# Patient Record
Sex: Male | Born: 1970 | Race: White | Hispanic: No | Marital: Married | Smoking: Never smoker
Health system: Southern US, Community
[De-identification: ages and names within clinical notes are randomized; demographics above are authoritative.]

## PROBLEM LIST (undated history)

## (undated) DIAGNOSIS — K219 Gastro-esophageal reflux disease without esophagitis: Secondary | ICD-10-CM

## (undated) DIAGNOSIS — M109 Gout, unspecified: Secondary | ICD-10-CM

## (undated) DIAGNOSIS — E785 Hyperlipidemia, unspecified: Secondary | ICD-10-CM

## (undated) HISTORY — PX: VENTRAL HERNIA REPAIR: SHX424

---

## 2019-06-27 DIAGNOSIS — R9431 Abnormal electrocardiogram [ECG] [EKG]: Secondary | ICD-10-CM

## 2019-06-27 DIAGNOSIS — R079 Chest pain, unspecified: Secondary | ICD-10-CM | POA: Diagnosis not present

## 2019-06-27 DIAGNOSIS — E785 Hyperlipidemia, unspecified: Secondary | ICD-10-CM | POA: Diagnosis not present

## 2019-06-27 DIAGNOSIS — I249 Acute ischemic heart disease, unspecified: Secondary | ICD-10-CM

## 2019-06-27 DIAGNOSIS — K219 Gastro-esophageal reflux disease without esophagitis: Secondary | ICD-10-CM

## 2019-06-28 ENCOUNTER — Encounter (HOSPITAL_COMMUNITY): Payer: Self-pay | Admitting: Internal Medicine

## 2019-06-28 ENCOUNTER — Inpatient Hospital Stay (HOSPITAL_COMMUNITY)
Admission: AD | Admit: 2019-06-28 | Discharge: 2019-07-01 | DRG: 247 | Disposition: A | Discharge status: Home / Self Care | Payer: BC Managed Care – PPO | Source: Other Acute Inpatient Hospital | Attending: Internal Medicine | Admitting: Internal Medicine

## 2019-06-28 DIAGNOSIS — I200 Unstable angina: Secondary | ICD-10-CM

## 2019-06-28 DIAGNOSIS — R931 Abnormal findings on diagnostic imaging of heart and coronary circulation: Secondary | ICD-10-CM | POA: Diagnosis not present

## 2019-06-28 DIAGNOSIS — Z955 Presence of coronary angioplasty implant and graft: Secondary | ICD-10-CM

## 2019-06-28 DIAGNOSIS — I5022 Chronic systolic (congestive) heart failure: Secondary | ICD-10-CM | POA: Diagnosis not present

## 2019-06-28 DIAGNOSIS — Z880 Allergy status to penicillin: Secondary | ICD-10-CM

## 2019-06-28 DIAGNOSIS — R001 Bradycardia, unspecified: Secondary | ICD-10-CM | POA: Diagnosis present

## 2019-06-28 DIAGNOSIS — Z8249 Family history of ischemic heart disease and other diseases of the circulatory system: Secondary | ICD-10-CM

## 2019-06-28 DIAGNOSIS — Z6837 Body mass index (BMI) 37.0-37.9, adult: Secondary | ICD-10-CM | POA: Diagnosis not present

## 2019-06-28 DIAGNOSIS — E782 Mixed hyperlipidemia: Secondary | ICD-10-CM | POA: Diagnosis present

## 2019-06-28 DIAGNOSIS — I25110 Atherosclerotic heart disease of native coronary artery with unstable angina pectoris: Secondary | ICD-10-CM | POA: Diagnosis present

## 2019-06-28 DIAGNOSIS — I209 Angina pectoris, unspecified: Secondary | ICD-10-CM

## 2019-06-28 DIAGNOSIS — E669 Obesity, unspecified: Secondary | ICD-10-CM | POA: Diagnosis present

## 2019-06-28 DIAGNOSIS — R7302 Impaired glucose tolerance (oral): Secondary | ICD-10-CM | POA: Diagnosis present

## 2019-06-28 DIAGNOSIS — R079 Chest pain, unspecified: Secondary | ICD-10-CM | POA: Diagnosis present

## 2019-06-28 DIAGNOSIS — I2510 Atherosclerotic heart disease of native coronary artery without angina pectoris: Secondary | ICD-10-CM

## 2019-06-28 DIAGNOSIS — R9431 Abnormal electrocardiogram [ECG] [EKG]: Secondary | ICD-10-CM | POA: Diagnosis not present

## 2019-06-28 DIAGNOSIS — I10 Essential (primary) hypertension: Secondary | ICD-10-CM | POA: Diagnosis present

## 2019-06-28 DIAGNOSIS — I249 Acute ischemic heart disease, unspecified: Secondary | ICD-10-CM | POA: Diagnosis not present

## 2019-06-28 DIAGNOSIS — M109 Gout, unspecified: Secondary | ICD-10-CM | POA: Diagnosis present

## 2019-06-28 DIAGNOSIS — K219 Gastro-esophageal reflux disease without esophagitis: Secondary | ICD-10-CM | POA: Diagnosis present

## 2019-06-28 DIAGNOSIS — Z95820 Peripheral vascular angioplasty status with implants and grafts: Secondary | ICD-10-CM | POA: Diagnosis not present

## 2019-06-28 DIAGNOSIS — E785 Hyperlipidemia, unspecified: Secondary | ICD-10-CM | POA: Diagnosis present

## 2019-06-28 HISTORY — DX: Gout, unspecified: M10.9

## 2019-06-28 HISTORY — DX: Gastro-esophageal reflux disease without esophagitis: K21.9

## 2019-06-28 HISTORY — DX: Hyperlipidemia, unspecified: E78.5

## 2019-06-28 LAB — RAPID URINE DRUG SCREEN, HOSP PERFORMED
Amphetamines: NOT DETECTED
Barbiturates: NOT DETECTED
Benzodiazepines: NOT DETECTED
Cocaine: NOT DETECTED
Opiates: NOT DETECTED
Tetrahydrocannabinol: NOT DETECTED

## 2019-06-28 LAB — BRAIN NATRIURETIC PEPTIDE: B Natriuretic Peptide: 53.2 pg/mL (ref 0.0–100.0)

## 2019-06-28 LAB — TROPONIN I (HIGH SENSITIVITY): Troponin I (High Sensitivity): 6 ng/L (ref <18)

## 2019-06-28 LAB — HIV ANTIBODY (ROUTINE TESTING W REFLEX): HIV Screen 4th Generation wRfx: NONREACTIVE

## 2019-06-28 MED ORDER — MORPHINE SULFATE (PF) 2 MG/ML IV SOLN: 2.0000 mg | INTRAVENOUS | Status: DC | PRN

## 2019-06-28 MED ORDER — NITROGLYCERIN 0.4 MG SL SUBL
0.4000 mg | SUBLINGUAL_TABLET | SUBLINGUAL | Status: DC | PRN
  Administered 2019-06-29 (×3): 0.4 mg via SUBLINGUAL
  Filled 2019-06-28: qty 1

## 2019-06-28 MED ORDER — PANTOPRAZOLE SODIUM 40 MG PO TBEC
40.0000 mg | DELAYED_RELEASE_TABLET | Freq: Every day | ORAL | Status: DC
  Administered 2019-06-29 – 2019-06-30 (×2): 40 mg via ORAL
  Filled 2019-06-28 (×2): qty 1

## 2019-06-28 MED ORDER — FEBUXOSTAT 40 MG PO TABS
40.0000 mg | ORAL_TABLET | Freq: Every day | ORAL | Status: DC
  Filled 2019-06-28 (×3): qty 1

## 2019-06-28 MED ORDER — HEPARIN SODIUM (PORCINE) 5000 UNIT/ML IJ SOLN: 5000.0000 [IU] | Freq: Three times a day (TID) | INTRAMUSCULAR | Status: DC

## 2019-06-28 MED ORDER — ASPIRIN EC 81 MG PO TBEC
81.0000 mg | DELAYED_RELEASE_TABLET | Freq: Every day | ORAL | Status: DC
  Administered 2019-06-29 – 2019-07-01 (×3): 81 mg via ORAL
  Filled 2019-06-28 (×4): qty 1

## 2019-06-28 MED ORDER — ACETAMINOPHEN 325 MG PO TABS
650.0000 mg | ORAL_TABLET | ORAL | Status: DC | PRN
  Administered 2019-06-29 – 2019-06-30 (×3): 650 mg via ORAL
  Filled 2019-06-28 (×3): qty 2

## 2019-06-28 MED ORDER — ONDANSETRON HCL 4 MG/2ML IJ SOLN: 4.0000 mg | Freq: Four times a day (QID) | INTRAMUSCULAR | Status: DC | PRN

## 2019-06-28 MED ORDER — ATORVASTATIN CALCIUM 40 MG PO TABS
40.0000 mg | ORAL_TABLET | Freq: Every day | ORAL | Status: DC
  Administered 2019-06-29 – 2019-06-30 (×2): 40 mg via ORAL
  Filled 2019-06-28 (×3): qty 1

## 2019-06-28 NOTE — H&P (Signed)
History and Physical    Randall RoChadwick Zaldivar WUJ:[id number] DOB: 1971-05-08 DOA: 06/28/2019  Referring MD/NP/PA:   PCP: Judge StallBaker, Clif[place], MD   Patient coming from:  The patient is coming from home.  At baseline, pt is independent for most of ADL.        Chief Complaint: chest pain  HPI: Randall Mullen is a 48 y.o. male with medical history significant of hyperlipidemia, GERD, gout, who was transferred from [facility] due to chest pain.  Patient states he was in his usual state of health until July when he noted postprandial substernal chest discomfort with exertion which he attributed to indigestion.  He notes this discomfort occurred once every couple of weeks and resolved with rest.  Over the past week however he has noted increasing episodes of chest pain with decreasing exertion.  His chest pain is located substernal area, 8 out of 10 in severity at the worst time, currently chest pain-free, sometimes his chest pain is squeezing-like pain, radiating to the shoulders.  No shortness of breath, cough, fever or chills.  No tenderness in the calf areas.  Patient does not have nausea, vomiting, diarrhea, abdominal pain, symptoms of UTI.  Pt was initially seen in [facility].  His troponins were negative x4.  His lipid panel was as follows: Triglycerides 209, LDL 64, HDL 31, total cholesterol 956137.  The ECG had some nonspecific T wave changes.  He underwent a myocardial perfusion study that revealed moderate inferolateral reversible changes consistent with ischemia.  The ejection fraction on the nuclear study was estimated to be 37%.  He was therefore referred to [facility] for further evaluation. Of note, pt has strong family history for early CAD with paternal grandfather dying at age 46 of an MI and his father having stents placed in his mid 7150s.  Review of Systems:   General: no fevers, chills, no body weight gain, fatigue HEENT: no blurry vision, hearing changes or sore  throat Respiratory: no dyspnea, coughing, wheezing CV: has chest pain, no palpitations GI: no nausea, vomiting, abdominal pain, diarrhea, constipation GU: no dysuria, burning on urination, increased urinary frequency, hematuria  Ext: no leg edema Neuro: no unilateral weakness, numbness, or tingling, no vision change or hearing loss Skin: no rash, no skin tear. MSK: No muscle spasm, no deformity, no limitation of range of movement in spin Heme: No easy bruising.  Travel history: No recent long distant travel.  Allergy:  Allergies  Allergen Reactions  . Amoxicillin Rash    Past Medical History:  Diagnosis Date  . GERD (gastroesophageal reflux disease)   . Gout   . HLD (hyperlipidemia)     Past Surgical History:  Procedure Laterality Date  . VENTRAL HERNIA REPAIR      Social History:  reports that he has never smoked. He has never used smokeless tobacco. He reports current alcohol use. He reports that he does not use drugs.  Family History:  Family History  Problem Relation Age of Onset  . CAD Father   . Heart attack Paternal Grandmother      Prior to Admission medications   Not on File    Physical Exam: Vitals:   06/28/19 2142 06/29/19 0019 06/29/19 0511  BP: 135/88 119/67 128/73  Pulse: (!) 57 (!) 53 (!) 51  Resp: 20 20 20   Temp: 98 F (36.7 C) 97.8 F (36.6 C) 97.6 F (36.4 C)  TempSrc: Oral Oral Oral  SpO2: 98% 96% 93%  Weight: (!) 136.9 kg  Height: 6\' 3"  (1.905 m)     General: Not in acute distress HEENT:       Eyes: PERRL, EOMI, no scleral icterus.       ENT: No discharge from the ears and nose, no pharynx injection, no tonsillar enlargement.        Neck: No JVD, no bruit, no mass felt. Heme: No neck lymph node enlargement. Cardiac: S1/S2, RRR, No murmurs, No gallops or rubs. Respiratory: No rales, wheezing, rhonchi or rubs. GI: Soft, nondistended, nontender, no rebound pain, no organomegaly, BS present. GU: No hematuria Ext: No pitting leg  edema bilaterally. 2+DP/PT pulse bilaterally. Musculoskeletal: No joint deformities, No joint redness or warmth, no limitation of ROM in spin. Skin: No rashes.  Neuro: Alert, oriented X3, cranial nerves II-XII grossly intact, moves all extremities normally. Psych: Patient is not psychotic, no suicidal or hemocidal ideation.  Labs on Admission: I have personally reviewed following labs and imaging studies  CBC: Recent Labs  Lab 06/29/19 0644  WBC 7.8  HGB 15.6  HCT 45.4  MCV 87.1  PLT 226   Basic Metabolic Panel: No results for input(s): NA, K, CL, CO2, GLUCOSE, BUN, CREATININE, CALCIUM, MG, PHOS in the last 168 hours. GFR: CrCl cannot be calculated (No successful lab value found.). Liver Function Tests: No results for input(s): AST, ALT, ALKPHOS, BILITOT, PROT, ALBUMIN in the last 168 hours. No results for input(s): LIPASE, AMYLASE in the last 168 hours. No results for input(s): AMMONIA in the last 168 hours. Coagulation Profile: No results for input(s): INR, PROTIME in the last 168 hours. Cardiac Enzymes: No results for input(s): CKTOTAL, CKMB, CKMBINDEX, TROPONINI in the last 168 hours. BNP (last 3 results) No results for input(s): PROBNP in the last 8760 hours. HbA1C: Recent Labs    06/29/19 0014  HGBA1C 6.2*   CBG: No results for input(s): GLUCAP in the last 168 hours. Lipid Profile: No results for input(s): CHOL, HDL, LDLCALC, TRIG, CHOLHDL, LDLDIRECT in the last 72 hours. Thyroid Function Tests: No results for input(s): TSH, T4TOTAL, FREET4, T3FREE, THYROIDAB in the last 72 hours. Anemia Panel: No results for input(s): VITAMINB12, FOLATE, FERRITIN, TIBC, IRON, RETICCTPCT in the last 72 hours. Urine analysis: No results found for: COLORURINE, APPEARANCEUR, LABSPEC, PHURINE, GLUCOSEU, HGBUR, BILIRUBINUR, KETONESUR, PROTEINUR, UROBILINOGEN, NITRITE, LEUKOCYTESUR Sepsis Labs: @LABRCNTIP (procalcitonin:4,lacticidven:4) )No results found for this or any previous visit  (from the past 240 hour(s)).   Radiological Exams on Admission: No results found.   Lab from [place] [facility]:  06/28/19 07:55: WBC 7.5, RBC 5.08, Hgb 14.6, Hct 44.8, MCV 88, MCH 28.8, MCHC 32.6 L, RDW 13.8, Plt Count 200, MPV 9.2 06/28/19 07:55: APTT 43.4 H 06/28/19 07:55: Troponin I < 0.01 06/28/19 03:40: Sodium 136 L, Potassium 4.1, Chloride 102, Carbon Dioxide 29, Anion Gap 9, BUN 15, Creatinine 1.30 H, Estimated GFR (MDRD) 60, Glucose 101 H, Calculated Osmolality 263 L, Calcium 9.0, Magnesium 2.10, Triglycerides 209 H, Cholesterol 137, LDL Cholesterol, Calc 64.2, VLDL Cholesterol, Calc 41.8 H, HDL Cholesterol 31.0 L, Cholesterol/HDL Ratio 4.4 06/28/19 03:40: Creatine Kinase 274 H, CK-MB (CK-2) 1.3, Troponin I < 0.01 06/28/19 00:20: APTT 35.6 H 06/27/19 23:05: Troponin I 0.04 06/27/19 20:00: Troponin I < 0.01 06/27/19 20:00: TSH 1.57 06/27/19 16:46: Troponin I 0.02 06/27/19 13:57: PT 11.2, INR 1.0, APTT 25.4 06/27/19 13:57: WBC 6.8, RBC 5.31, Hgb 15.3, Hct 46.4, MCV 87, MCH 28.8, MCHC 33.0, RDW 13.9, Plt Count 220, MPV 9.0, Neut % (Auto) 47.7, Lymph % (Auto) 42.2, Mono % (Auto) 7.0,  Eos % (Auto) 1.4, Baso % (Auto) 1.7, Absolute Neuts (auto) 3.20, Absolute Lymphs (auto) 2.86 06/27/19 13:57: Sodium 141, Potassium 4.0, Chloride 104, Carbon Dioxide 28, Anion Gap 13, BUN 12, Creatinine 1.20, Estimated GFR (MDRD) > 60, Glucose 100 H, Calculated Osmolality 271, Calcium 8.9, Total Bilirubin 0.7, AST 37, ALT 56 H, Alkaline Phosphatase 49, Troponin I < 0.01, Pro-B-Natriuretic Pept 154, Total Protein 7.1, Albumin 4.3      EKG: will get one.   Assessment/Plan Principal Problem:   Chest pain Active Problems:   HLD (hyperlipidemia)   Gout   GERD (gastroesophageal reflux disease)   Chest pain: pt had positive NM myocardial perfusion stress test.  During the stress test was complaining of chest pain, was relieved by nitroglycerin. He has reduced LV function, ejection fraction 37%.   Cardiology was consulted, possibly need cardiac cath.  - will admit to Tele bed as inpt - Trend Trop - Repeat EKG in the am  - prn Nitroglycerin, Morphine, and aspirin, lipitor  - check A1C  And UDS - f/u card recommendation.  Hyperlipidemia -Continue atorvastatin  GERD -Continue omeprazole  Gout -uloric     DVT ppx: SQ Heparin  Code Status: Full code Family Communication: None at bed side. Disposition Plan:  Anticipate discharge back to previous home environment Consults called:  Dr. Paticia Stack of card Admission status: Obs / tele  Inpatient/tele   medical floor/obs     SDU/inpation       Date of Service 06/29/2019    [facility]   If 7PM-7AM, please contact night-coverage [email or web address] Password [email or web address] 06/29/2019, 7:06 AM

## 2019-06-28 NOTE — Consult Note (Signed)
[facility] Consult Note   Primary Physician:  Judge Stall, MD  Primary Cardiologist:   Revankar  Reason for Consultation:  Abnormal stress test  HPI:    Randall Mullen is a 48 year old male with a past medical history significant for hyperlipidemia, GERD, gout and family history of premature CAD who presents with complaints of substernal chest pain.  He has been experiencing postprandial discomfort for a few months.  More recently the frequency of his chest pain has increased.  The discomfort is provoked on medium to severe exertion such as when climbing uphill.  He describes the pain as pressure-like, substernal and radiating to the back.  He denied any associated shortness of breath, diaphoresis, nausea or vomiting.  On the day of his hospital admission he woke up with chest pain.  He reported that the pain radiated to both of his shoulders.  It lasted longer than usual and was more severe in intensity.   He initially presented to [facility].  His troponins were negative x4.  His lipid panel was as follows: Triglycerides 209, LDL 64, HDL 31, total cholesterol 038.  The ECG had some nonspecific T wave changes.  He underwent a myocardial perfusion study that revealed moderate inferolateral reversible changes consistent with ischemia.  The ejection fraction on the nuclear study was estimated to be 37%.  He was therefore referred to [facility] for further evaluation.   Home Medications Prior to Admission medications   Not on File    Past Medical History: Past Medical History:  Diagnosis Date  . GERD (gastroesophageal reflux disease)   . Gout   . HLD (hyperlipidemia)     Past Surgical History: Hernia repair  Family History: No family history on file.  Social History: Social History   Socioeconomic History  . Marital status: Married    Spouse name: Not on file  . Number of children: Not on file  . Years of education: Not on file  . Highest  education level: Not on file  Occupational History  . Not on file  Social Needs  . Financial resource strain: Not on file  . Food insecurity    Worry: Not on file    Inability: Not on file  . Transportation needs    Medical: Not on file    Non-medical: Not on file  Tobacco Use  . Smoking status: Not on file  Substance and Sexual Activity  . Alcohol use: Not on file  . Drug use: Not on file  . Sexual activity: Not on file  Lifestyle  . Physical activity    Days per week: Not on file    Minutes per session: Not on file  . Stress: Not on file  Relationships  . Social Musician on phone: Not on file    Gets together: Not on file    Attends religious service: Not on file    Active member of club or organization: Not on file    Attends meetings of clubs or organizations: Not on file    Relationship status: Not on file  Other Topics Concern  . Not on file  Social History Narrative  . Not on file    Allergies:  Not on File   Review of Systems: [y] = yes, [ ]  = no   . General: Weight gain [ ] ; Weight loss [ ] ; Anorexia [ ] ; Fatigue [ ] ; Fever [ ] ; Chills [ ] ; Weakness [ ]   . Cardiac:  Chest pain/pressure [Y]; Resting SOB [ ] ; Exertional SOB [ ] ; Orthopnea [ ] ; Pedal Edema [ ] ; Palpitations [ ] ; Syncope [ ] ; Presyncope [ ] ; Paroxysmal nocturnal dyspnea[ ]   . Pulmonary: Cough [ ] ; Wheezing[ ] ; Hemoptysis[ ] ; Sputum [ ] ; Snoring [ ]   . GI: Vomiting[ ] ; Dysphagia[ ] ; Melena[ ] ; Hematochezia [ ] ; Heartburn[ ] ; Abdominal pain [ ] ; Constipation [ ] ; Diarrhea [ ] ; BRBPR [ ]   . GU: Hematuria[ ] ; Dysuria [ ] ; Nocturia[ ]   . Vascular: Pain in legs with walking [ ] ; Pain in feet with lying flat [ ] ; Non-healing sores [ ] ; Stroke [ ] ; TIA [ ] ; Slurred speech [ ] ;  . Neuro: Headaches[ ] ; Vertigo[ ] ; Seizures[ ] ; Paresthesias[ ] ;Blurred vision [ ] ; Diplopia [ ] ; Vision changes [ ]   . Ortho/Skin: Arthritis [ ] ; Joint pain [ ] ; Muscle pain [ ] ; Joint swelling [ ] ; Back Pain [ ] ; Rash  [ ]   . Psych: Depression[ ] ; Anxiety[ ]   . Heme: Bleeding problems [ ] ; Clotting disorders [ ] ; Anemia [ ]   . Endocrine: Diabetes [ ] ; Thyroid dysfunction[ ]      Objective:    Vital Signs:   Temp:  [98 F (36.7 C)] 98 F (36.7 C) (10/13 2142) Pulse Rate:  [57] 57 (10/13 2142) Resp:  [20] 20 (10/13 2142) BP: (135)/(88) 135/88 (10/13 2142) SpO2:  [98 %] 98 % (10/13 2142) Weight:  [136.9 kg] 136.9 kg (10/13 2142)    Weight change: Filed Weights   06/28/19 2142  Weight: (!) 136.9 kg    Intake/Output:  No intake or output data in the 24 hours ending 06/28/19 2231    Physical Exam    General:  Well appearing. No resp difficulty HEENT: normal Neck: supple. JVP . Carotids 2+ bilat; no bruits. No lymphadenopathy or thyromegaly appreciated. Cor: PMI nondisplaced. Regular rate & rhythm. No rubs, gallops or murmurs. Lungs: clear to auscultation bilaterally Abdomen: soft, nontender, nondistended. No hepatosplenomegaly. No bruits or masses. Good bowel sounds. Extremities: no cyanosis, clubbing, rash, edema Neuro: alert & orientedx3, cranial nerves grossly intact. moves all 4 extremities w/o difficulty.  Affect pleasant    Labs   Basic Metabolic Panel: No results for input(s): NA, K, CL, CO2, GLUCOSE, BUN, CREATININE, CALCIUM, MG, PHOS in the last 168 hours.  Liver Function Tests: No results for input(s): AST, ALT, ALKPHOS, BILITOT, PROT, ALBUMIN in the last 168 hours. No results for input(s): LIPASE, AMYLASE in the last 168 hours. No results for input(s): AMMONIA in the last 168 hours.  CBC: No results for input(s): WBC, NEUTROABS, HGB, HCT, MCV, PLT in the last 168 hours.  Cardiac Enzymes: No results for input(s): CKTOTAL, CKMB, CKMBINDEX, TROPONINI in the last 168 hours.  BNP: BNP (last 3 results) No results for input(s): BNP in the last 8760 hours.  ProBNP (last 3 results) No results for input(s): PROBNP in the last 8760 hours.   CBG: No results for input(s):  GLUCAP in the last 168 hours.  Coagulation Studies: No results for input(s): LABPROT, INR in the last 72 hours.   Imaging    No results found.    Assessment/Plan    1.  Abnormal stress test The patient has risk factors for coronary artery disease.  He presents with typical symptoms of substernal chest pain.  His cardiac biomarkers were negative.  His ECG had nonspecific findings.  He eventually underwent a nuclear stress test that showed reversible changes in the inferolateral wall.  The EF was  37%.  He has been transferred to [facility] for consideration for a cardiac catheterization procedure.  -Admit to telemetry -Daily ECGs -Aspirin 81 mg daily -High-dose statins -Nitroglycerin as needed for chest pain -We will hold off on IV unfractionated heparin since patient is chest pain-free and cardiac biomarkers are negative -Keep the patient NPO -We will tentatively plan for diagnostic coronary angiography in the morning to delineate his coronary anatomy.  2.  Reduced LV function (on nuclear stress test)  -Transthoracic echocardiogram to evaluate the patient's LV function.  There is a suggestion of reduced function on the patient's myocardial perfusion imaging study. -Daily weights -Strict I and O's   Lonie PeakMohammed W Amilia Vandenbrink, MD  06/28/2019, 10:31 PM  Cardiology Overnight Team Please contact [facility] for night-coverage after hours (4p -7a ) and weekends on [email or web address]

## 2019-06-29 ENCOUNTER — Other Ambulatory Visit: Payer: Self-pay

## 2019-06-29 ENCOUNTER — Encounter (HOSPITAL_COMMUNITY)
Admission: AD | Disposition: A | Discharge status: Home / Self Care | Payer: Self-pay | Source: Other Acute Inpatient Hospital | Attending: Internal Medicine

## 2019-06-29 ENCOUNTER — Encounter (HOSPITAL_COMMUNITY): Payer: Self-pay

## 2019-06-29 ENCOUNTER — Inpatient Hospital Stay (HOSPITAL_COMMUNITY): Payer: BC Managed Care – PPO

## 2019-06-29 DIAGNOSIS — I2510 Atherosclerotic heart disease of native coronary artery without angina pectoris: Secondary | ICD-10-CM

## 2019-06-29 DIAGNOSIS — I25110 Atherosclerotic heart disease of native coronary artery with unstable angina pectoris: Principal | ICD-10-CM

## 2019-06-29 DIAGNOSIS — E785 Hyperlipidemia, unspecified: Secondary | ICD-10-CM

## 2019-06-29 DIAGNOSIS — R079 Chest pain, unspecified: Secondary | ICD-10-CM

## 2019-06-29 HISTORY — PX: LEFT HEART CATH AND CORONARY ANGIOGRAPHY: CATH118249

## 2019-06-29 LAB — CBC
HCT: 45.4 % (ref 39.0–52.0)
Hemoglobin: 15.6 g/dL (ref 13.0–17.0)
MCH: 29.9 pg (ref 26.0–34.0)
MCHC: 34.4 g/dL (ref 30.0–36.0)
MCV: 87.1 fL (ref 80.0–100.0)
Platelets: 226 10*3/uL (ref 150–400)
RBC: 5.21 MIL/uL (ref 4.22–5.81)
RDW: 13.2 % (ref 11.5–15.5)
WBC: 7.8 10*3/uL (ref 4.0–10.5)
nRBC: 0 % (ref 0.0–0.2)

## 2019-06-29 LAB — BASIC METABOLIC PANEL
Anion gap: 10 (ref 5–15)
BUN: 13 mg/dL (ref 6–20)
CO2: 26 mmol/L (ref 22–32)
Calcium: 9.4 mg/dL (ref 8.9–10.3)
Chloride: 103 mmol/L (ref 98–111)
Creatinine, Ser: 1.54 mg/dL — ABNORMAL HIGH (ref 0.61–1.24)
GFR calc Af Amer: >60 mL/min (ref >60)
GFR calc non Af Amer: 54 mL/min — ABNORMAL LOW (ref >60)
Glucose, Bld: 106 mg/dL — ABNORMAL HIGH (ref 70–99)
Potassium: 4.2 mmol/L (ref 3.5–5.1)
Sodium: 139 mmol/L (ref 135–145)

## 2019-06-29 LAB — HEMOGLOBIN A1C
Hgb A1c MFr Bld: 6.2 % — ABNORMAL HIGH (ref 4.8–5.6)
Mean Plasma Glucose: 131.24 mg/dL

## 2019-06-29 LAB — TROPONIN I (HIGH SENSITIVITY): Troponin I (High Sensitivity): 5 ng/L (ref <18)

## 2019-06-29 SURGERY — LEFT HEART CATH AND CORONARY ANGIOGRAPHY
Anesthesia: LOCAL

## 2019-06-29 MED ORDER — VERAPAMIL HCL 2.5 MG/ML IV SOLN
INTRAVENOUS | Status: DC | PRN
  Administered 2019-06-29: 10 mL via INTRA_ARTERIAL

## 2019-06-29 MED ORDER — HEPARIN (PORCINE) IN NACL 1000-0.9 UT/500ML-% IV SOLN
INTRAVENOUS | Status: AC
  Filled 2019-06-29: qty 1000

## 2019-06-29 MED ORDER — MIDAZOLAM HCL 2 MG/2ML IJ SOLN
INTRAMUSCULAR | Status: DC | PRN
  Administered 2019-06-29: 2 mg via INTRAVENOUS

## 2019-06-29 MED ORDER — TICAGRELOR 90 MG PO TABS
90.0000 mg | ORAL_TABLET | Freq: Two times a day (BID) | ORAL | Status: DC
  Administered 2019-06-29 – 2019-07-01 (×4): 90 mg via ORAL
  Filled 2019-06-29 (×5): qty 1

## 2019-06-29 MED ORDER — SODIUM CHLORIDE 0.9 % IV SOLN: 250.0000 mL | INTRAVENOUS | Status: DC | PRN

## 2019-06-29 MED ORDER — IOHEXOL 350 MG/ML SOLN
INTRAVENOUS | Status: DC | PRN
  Administered 2019-06-29: 80 mL via INTRA_ARTERIAL

## 2019-06-29 MED ORDER — HEPARIN SODIUM (PORCINE) 1000 UNIT/ML IJ SOLN
INTRAMUSCULAR | Status: DC | PRN
  Administered 2019-06-29: 6000 [IU] via INTRAVENOUS

## 2019-06-29 MED ORDER — VERAPAMIL HCL 2.5 MG/ML IV SOLN
INTRAVENOUS | Status: AC
  Filled 2019-06-29: qty 2

## 2019-06-29 MED ORDER — HEPARIN (PORCINE) IN NACL 1000-0.9 UT/500ML-% IV SOLN
INTRAVENOUS | Status: DC | PRN
  Administered 2019-06-29 (×2): 500 mL

## 2019-06-29 MED ORDER — HYDRALAZINE HCL 20 MG/ML IJ SOLN: 10.0000 mg | INTRAMUSCULAR | Status: AC | PRN

## 2019-06-29 MED ORDER — SODIUM CHLORIDE 0.9 % WEIGHT BASED INFUSION
3.0000 mL/kg/h | INTRAVENOUS | Status: DC
  Administered 2019-06-29: 3 mL/kg/h via INTRAVENOUS

## 2019-06-29 MED ORDER — SODIUM CHLORIDE 0.9% FLUSH: 3.0000 mL | INTRAVENOUS | Status: DC | PRN

## 2019-06-29 MED ORDER — HEPARIN SODIUM (PORCINE) 1000 UNIT/ML IJ SOLN
INTRAMUSCULAR | Status: AC
  Filled 2019-06-29: qty 1

## 2019-06-29 MED ORDER — SODIUM CHLORIDE 0.9 % IV SOLN
INTRAVENOUS | Status: AC
  Administered 2019-06-29: 11:00:00 via INTRAVENOUS

## 2019-06-29 MED ORDER — TICAGRELOR 90 MG PO TABS
180.0000 mg | ORAL_TABLET | Freq: Once | ORAL | Status: AC
  Administered 2019-06-29: 180 mg via ORAL
  Filled 2019-06-29: qty 2

## 2019-06-29 MED ORDER — FENTANYL CITRATE (PF) 100 MCG/2ML IJ SOLN
INTRAMUSCULAR | Status: DC | PRN
  Administered 2019-06-29: 50 ug via INTRAVENOUS

## 2019-06-29 MED ORDER — LIDOCAINE HCL (PF) 1 % IJ SOLN
INTRAMUSCULAR | Status: AC
  Filled 2019-06-29: qty 30

## 2019-06-29 MED ORDER — HEPARIN SODIUM (PORCINE) 5000 UNIT/ML IJ SOLN: 5000.0000 [IU] | Freq: Three times a day (TID) | INTRAMUSCULAR | Status: DC

## 2019-06-29 MED ORDER — FENTANYL CITRATE (PF) 100 MCG/2ML IJ SOLN
INTRAMUSCULAR | Status: AC
  Filled 2019-06-29: qty 2

## 2019-06-29 MED ORDER — SODIUM CHLORIDE 0.9 % WEIGHT BASED INFUSION
1.0000 mL/kg/h | INTRAVENOUS | Status: DC
  Administered 2019-06-29: 1 mL/kg/h via INTRAVENOUS

## 2019-06-29 MED ORDER — MIDAZOLAM HCL 2 MG/2ML IJ SOLN
INTRAMUSCULAR | Status: AC
  Filled 2019-06-29: qty 2

## 2019-06-29 MED ORDER — LABETALOL HCL 5 MG/ML IV SOLN: 10.0000 mg | INTRAVENOUS | Status: AC | PRN

## 2019-06-29 MED ORDER — LIDOCAINE HCL (PF) 1 % IJ SOLN
INTRAMUSCULAR | Status: DC | PRN
  Administered 2019-06-29: 2 mL

## 2019-06-29 MED ORDER — SODIUM CHLORIDE 0.9% FLUSH: 3.0000 mL | Freq: Two times a day (BID) | INTRAVENOUS | Status: DC

## 2019-06-29 MED ORDER — SODIUM CHLORIDE 0.9% FLUSH
3.0000 mL | Freq: Two times a day (BID) | INTRAVENOUS | Status: DC
  Administered 2019-06-29: 3 mL via INTRAVENOUS

## 2019-06-29 MED ORDER — NITROGLYCERIN 2 % TD OINT
1.0000 [in_us] | TOPICAL_OINTMENT | Freq: Four times a day (QID) | TRANSDERMAL | Status: DC
  Administered 2019-06-29 – 2019-06-30 (×3): 1 [in_us] via TOPICAL
  Filled 2019-06-29: qty 30

## 2019-06-29 SURGICAL SUPPLY — 9 items
CATH 5FR JL3.5 JR4 ANG PIG MP (CATHETERS) ×2 IMPLANT
DEVICE RAD COMP TR BAND LRG (VASCULAR PRODUCTS) ×2 IMPLANT
GLIDESHEATH SLEND SS 6F .021 (SHEATH) ×2 IMPLANT
GUIDEWIRE INQWIRE 1.5J.035X260 (WIRE) ×1 IMPLANT
INQWIRE 1.5J .035X260CM (WIRE) ×2
KIT HEART LEFT (KITS) ×2 IMPLANT
PACK CARDIAC CATHETERIZATION (CUSTOM PROCEDURE TRAY) ×2 IMPLANT
TRANSDUCER W/STOPCOCK (MISCELLANEOUS) ×2 IMPLANT
TUBING CIL FLEX 10 FLL-RA (TUBING) ×2 IMPLANT

## 2019-06-29 NOTE — Care Management (Signed)
06-29-19 1319 Benefits check submitted for Brilinta. CM will make patient aware of cost once completed. Brilinta discount card to be provided to patient once stable to transition home. No further needs from CM at this time. Bethena Roys, RN,BSN Case Manager [phone]

## 2019-06-29 NOTE — TOC Benefit Eligibility Note (Signed)
Transition of Care [facility]) Benefit Eligibility Note    Patient Details  Name: Makale Pindell MRN: [id number] Date of Birth: [birth year]   Medication/Dose: BRILINTA   90 MG BID  Covered?: Yes  Tier: 2 Drug  Prescription [facility]age [facility]  M/O  , 90 DAY SUPPLY FOR RETAIL OR M/O $90.00  Spoke with Person/Company/Phone Number:: CHRISTOPHER @ [facility] # [phone] OPT- MEMBER  Co-Pay: $30.00  Prior Approval: No  Deductible: Unmet  Additional Notes: TICAGRELOR : Crecencio Mc Phone Number: 06/29/2019, 2:35 PM

## 2019-06-29 NOTE — H&P (View-Only) (Signed)
Progress Note  Patient Name: Randall Mullen Date of Encounter: 06/29/2019  Primary Cardiologist: New--Dr. Geraldo Pitter ([place])  Subjective   No chest pain currently. Endorses that he was working out 6d/week on treadmill and lifting weights without issue, then [place] with central chest pressure that radiates to neck/upper back. Father has had 6 caths. Risk factor of HLD, family history. No tobacco use.  Inpatient Medications    Scheduled Meds: . aspirin EC  81 mg Oral Daily  . atorvastatin  40 mg Oral q1800  . febuxostat  40 mg Oral Daily  . heparin  5,000 Units Subcutaneous Q8H  . pantoprazole  40 mg Oral Q1200  . sodium chloride flush  3 mL Intravenous Q12H   Continuous Infusions: . sodium chloride    . sodium chloride     PRN Meds: sodium chloride, acetaminophen, morphine injection, nitroGLYCERIN, ondansetron (ZOFRAN) IV, sodium chloride flush   Vital Signs    Vitals:   06/28/19 2142 06/29/19 0019 06/29/19 0511 06/29/19 0841  BP: 135/88 119/67 128/73 124/80  Pulse: (!) 57 (!) 53 (!) 51 (!) 51  Resp: 20 20 20 20   Temp: 98 F (36.7 C) 97.8 F (36.6 C) 97.6 F (36.4 C)   TempSrc: Oral Oral Oral   SpO2: 98% 96% 93% 95%  Weight: (!) 136.9 kg  (!) 136.5 kg   Height: 6\' 3"  (1.905 m)       Intake/Output Summary (Last 24 hours) at 06/29/2019 0927 Last data filed at 06/28/2019 2200 Gross per 24 hour  Intake 360 ml  Output -  Net 360 ml   Last 3 Weights 06/29/2019 06/28/2019  Weight (lbs) 301 lb 301 lb 14.4 oz  Weight (kg) 136.533 kg 136.941 kg      Telemetry    SR with rare PVCs- Personally Reviewed  ECG    SR with ST flattening anterior, inferior, lateral and TWI lateral - Personally Reviewed  Physical Exam   GEN: No acute distress.   Neck: No JVD Cardiac: RRR, no murmurs, rubs, or gallops.  Respiratory: Clear to auscultation bilaterally. GI: Soft, nontender, non-distended  MS: No edema; No deformity. Neuro:  Nonfocal   Psych: Normal affect   Labs    High Sensitivity Troponin:   Recent Labs  Lab 06/28/19 2213 06/29/19 0014  TROPONINIHS 6 5      Chemistry Recent Labs  Lab 06/29/19 0644  NA 139  K 4.2  CL 103  CO2 26  GLUCOSE 106*  BUN 13  CREATININE 1.54*  CALCIUM 9.4  GFRNONAA 54*  GFRAA >60  ANIONGAP 10     Hematology Recent Labs  Lab 06/29/19 0644  WBC 7.8  RBC 5.21  HGB 15.6  HCT 45.4  MCV 87.1  MCH 29.9  MCHC 34.4  RDW 13.2  PLT 226    BNP Recent Labs  Lab 06/28/19 2213  BNP 53.2     DDimer No results for input(s): DDIMER in the last 168 hours.   Radiology    No results found.  Cardiac Studies   Lexiscan 06/28/19 [name]) Moderate area of moderately decreased activity in inferolateral wall near base, reversible. Fixed small defect in anteroseptal region. EF 37%  Patient Profile     48 y.o. male with risk factors of HLD, family history CAD presented to [facility] with progressive chest pain with exertion. Transferred to [facility] for cardiac catheterization.  Assessment & Plan    Progressive angina: abnormal stress test at [facility] -troponins negatve at [place],  negative hsTn here -lexiscan with reversible defect as well as small fixed scar.  -EF on lexiscan 37% -on aspirin, statin -not on heparin, headed to cath, will hold starting as he would not be therapeutic by procedure -nitropatch on, no PDEi with nitro -echo ordered -resting bradycardia, no room for beta blocker -if EF low on echo, will need to discuss ACEi/ARB/ARNI/MRA based on results of cath -appears euvolemic  CV risk factors: Hyperlipidemia: now on atorvastatin 40 mg, follow up post discharge Family history: father with 6 caths, multiple family members with MI in late 40s/50s Obesity: BMI 37, would benefit from weight loss Impaired glucose tolerance: A1c 6.2 No tobacco use BP well controlled on no meds, no history of HTN  Cath: discussed at length today Risks and benefits  of cardiac catheterization have been discussed with the patient.  These include bleeding, infection, kidney damage, stroke, heart attack, death.  The patient understands these risks and is willing to proceed.  For questions or updates, please contact [facility] Please consult [email or web address] for contact info under     Signed, Carianne Taira, MD  06/29/2019, 9:27 AM   

## 2019-06-29 NOTE — Progress Notes (Signed)
Pt c/o SHOB, chest tightness, and irregular breathing. Cardiology paged. EKG performed, nitro given x 2. O2 applied. Order received for nitro paste.

## 2019-06-29 NOTE — Progress Notes (Signed)
Progress Note  Patient Name: Randall Mullen Date of Encounter: 06/29/2019  Primary Cardiologist: New--Dr. Geraldo Mullen ([place])  Subjective   No chest pain currently. Endorses that he was working out 6d/week on treadmill and lifting weights without issue, [place] with central chest pressure that radiates to neck/upper back. Father has had 6 caths. Risk factor of HLD, family history. No tobacco use.  Inpatient Medications    Scheduled Meds: . aspirin EC  81 mg Oral Daily  . atorvastatin  40 mg Oral q1800  . febuxostat  40 mg Oral Daily  . heparin  5,000 Units Subcutaneous Q8H  . pantoprazole  40 mg Oral Q1200  . sodium chloride flush  3 mL Intravenous Q12H   Continuous Infusions: . sodium chloride    . sodium chloride     PRN Meds: sodium chloride, acetaminophen, morphine injection, nitroGLYCERIN, ondansetron (ZOFRAN) IV, sodium chloride flush   Vital Signs    Vitals:   06/28/19 2142 06/29/19 0019 06/29/19 0511 06/29/19 0841  BP: 135/88 119/67 128/73 124/80  Pulse: (!) 57 (!) 53 (!) 51 (!) 51  Resp: 20 20 20 20   Temp: 98 F (36.7 C) 97.8 F (36.6 C) 97.6 F (36.4 C)   TempSrc: Oral Oral Oral   SpO2: 98% 96% 93% 95%  Weight: (!) 136.9 kg  (!) 136.5 kg   Height: 6\' 3"  (1.905 m)       Intake/Output Summary (Last 24 hours) at 06/29/2019 0927 Last data filed at 06/28/2019 2200 Gross per 24 hour  Intake 360 ml  Output -  Net 360 ml   Last 3 Weights 06/29/2019 06/28/2019  Weight (lbs) 301 lb 301 lb 14.4 oz  Weight (kg) 136.533 kg 136.941 kg      Telemetry    SR with rare PVCs- Personally Reviewed  ECG    SR with ST flattening anterior, inferior, lateral and TWI lateral - Personally Reviewed  Physical Exam   GEN: No acute distress.   Neck: No JVD Cardiac: RRR, no murmurs, rubs, or gallops.  Respiratory: Clear to auscultation bilaterally. GI: Soft, nontender, non-distended  MS: No edema; No deformity. Neuro:  Nonfocal   Psych: Normal affect   Labs    High Sensitivity Troponin:   Recent Labs  Lab 06/28/19 2213 06/29/19 0014  TROPONINIHS 6 5      Chemistry Recent Labs  Lab 06/29/19 0644  NA 139  K 4.2  CL 103  CO2 26  GLUCOSE 106*  BUN 13  CREATININE 1.54*  CALCIUM 9.4  GFRNONAA 54*  GFRAA >60  ANIONGAP 10     Hematology Recent Labs  Lab 06/29/19 0644  WBC 7.8  RBC 5.21  HGB 15.6  HCT 45.4  MCV 87.1  MCH 29.9  MCHC 34.4  RDW 13.2  PLT 226    BNP Recent Labs  Lab 06/28/19 2213  BNP 53.2     DDimer No results for input(s): DDIMER in the last 168 hours.   Radiology    No results found.  Cardiac Studies   Lexiscan 06/28/19 [facility]) Moderate area of moderately decreased activity in inferolateral wall near base, reversible. Fixed small defect in anteroseptal region. EF 37%  Patient Profile     48 y.o. male with risk factors of HLD, family history CAD presented to [facility] with progressive chest pain with exertion. Transferred to [facility] for cardiac catheterization.  Assessment & Plan    Progressive angina: abnormal stress test at [facility] -troponins negatve at [facility],  negative hsTn here -lexiscan with reversible defect as well as small fixed scar.  -EF on lexiscan 37% -on aspirin, statin -not on heparin, headed to cath, will hold starting as he would not be therapeutic by procedure -nitropatch on, no PDEi with nitro -echo ordered -resting bradycardia, no room for beta blocker -if EF low on echo, will need to discuss ACEi/ARB/ARNI/MRA based on results of cath -appears euvolemic  CV risk factors: Hyperlipidemia: now on atorvastatin 40 mg, follow up post discharge Family history: father with 6 caths, multiple family members with MI in late 40s/50s Obesity: BMI 37, would benefit from weight loss Impaired glucose tolerance: A1c 6.2 No tobacco use BP well controlled on no meds, no history of HTN  Cath: discussed at length today Risks and benefits  of cardiac catheterization have been discussed with the patient.  These include bleeding, infection, kidney damage, stroke, heart attack, death.  The patient understands these risks and is willing to proceed.  For questions or updates, please contact Randall Mullen Please consult [email or web address] for contact info under     Signed, Randall Red, MD  06/29/2019, 9:27 AM

## 2019-06-29 NOTE — Progress Notes (Addendum)
Pt ambulated to bathroom and upon returning to his bed he stated that "he was having chest tightness, no pain just tightness." He declined Nitro SL and wanted to try O2. Within approximately 3-4 mins the tightness was gone (before O2). I advised him to use urinal and not go to the bathroom and placed O2 nasal cannula  for comfort. Will continue to monitor

## 2019-06-29 NOTE — Progress Notes (Signed)
  Echocardiogram 2D Echocardiogram has been performed.  Randall Mullen 06/29/2019, 1:37 PM

## 2019-06-29 NOTE — Plan of Care (Signed)
  Problem: Education: Goal: Knowledge of General Education information will improve Description: Including pain rating scale, medication(s)/side effects and non-pharmacologic comfort measures Outcome: Progressing   Problem: Health Behavior/Discharge Planning: Goal: Ability to manage health-related needs will improve Outcome: Progressing   Problem: Clinical Measurements: Goal: Ability to maintain clinical measurements within normal limits will improve Outcome: Progressing   Problem: Activity: Goal: Risk for activity intolerance will decrease Outcome: Progressing   Problem: Nutrition: Goal: Adequate nutrition will be maintained Outcome: Progressing   Problem: Coping: Goal: Level of anxiety will decrease Outcome: Progressing   Problem: Elimination: Goal: Will not experience complications related to bowel motility Outcome: Progressing   Problem: Safety: Goal: Ability to remain free from injury will improve Outcome: Progressing   

## 2019-06-29 NOTE — Interval H&P Note (Signed)
History and Physical Interval Note:  06/29/2019 9:59 AM  Randall Mullen  has presented today for surgery, with the diagnosis of unstable angina. The various methods of treatment have been discussed with the patient and family. After consideration of risks, benefits and other options for treatment, the patient has consented to  Procedure(s): LEFT HEART CATH AND CORONARY ANGIOGRAPHY (N/A) as a surgical intervention.  The patient's history has been reviewed, patient examined, no change in status, stable for surgery.  I have reviewed the patient's chart and labs.  Questions were answered to the patient's satisfaction.    Cath Lab Visit (complete for each Cath Lab visit)  Clinical Evaluation Leading to the Procedure:   ACS: No.  Non-ACS:    Anginal Classification: CCS III  Anti-ischemic medical therapy: No Therapy  Non-Invasive Test Results: High-risk stress test findings: cardiac mortality >3%/year  Prior CABG: No previous CABG        Lauree Chandler

## 2019-06-29 NOTE — Progress Notes (Signed)
PROGRESS NOTE        PATIENT DETAILS Name: Randall Mullen Age: 48 y.o. Sex: male Date of Birth: Nov 24, 1970 Admit Date: 06/28/2019 Admitting Physician Lorretta Harp, MD [id number], Ephriam Knuckles, MD  Brief Narrative: Patient is a 48 y.o. male 3 of HTN, GERD, gout-family history of premature CAD who presented to [facility] for substernal chest pain-found to have positive stress test at [facility] transferred to [facility] for [facility].  See below for further details.  Subjective: Chest pain-free this morning.  Assessment/Plan: Unstable angina: Chest pain free this morning-LHC on 10/14+ for multivessel disease-cardiology planning on PCI on 10/15.  Continue Brilinta/aspirin, statin.  Dyslipidemia: Continue statin  GERD: Continue PPI  Obesity: Estimated body mass index is 37.62 kg/m as calculated from the following:   Height as of this encounter: 6\' 3"  (1.905 m).   Weight as of this encounter: 136.5 kg.    Diet: Diet Order            Diet Heart Room service appropriate? Yes; Fluid consistency: Thin  Diet effective now               DVT Prophylaxis: Prophylactic Heparin   Code Status: Full code   Family Communication: None at bedside  Disposition Plan: Remain inpatient  Antimicrobial agents: Anti-infectives (From admission, onward)   None      Procedures: 10/14>> LHC: 1. Severe three vessel CAD 2. Severe stenosis proximal LAD. The mid and distal LAD fills briskly from antegrade flow but also seen to fill from faint right to left collaterals.  3. Moderate disease in the proximal Circumflex artery with chronic occlusion of the mid Circumflex just after the takeoff of the large obtuse marginal branch 4. Severe focal stenosis in the proximal segment of the large dominant RCA. Severe stenosis in the the proximal segment of the moderate caliber posterolateral artery 5. Normal filling pressures.   CONSULTS:  cardiology  Time spent: 25 minutes-Greater than 50% of this time was spent in counseling, explanation of diagnosis, planning of further management, and coordination of care.  MEDICATIONS: Scheduled Meds: . aspirin EC  81 mg Oral Daily  . atorvastatin  40 mg Oral q1800  . febuxostat  40 mg Oral Daily  . [START ON 06/30/2019] heparin  5,000 Units Subcutaneous Q8H  . pantoprazole  40 mg Oral Q1200  . sodium chloride flush  3 mL Intravenous Q12H  . ticagrelor  90 mg Oral BID   Continuous Infusions: . sodium chloride 75 mL/hr at 06/29/19 1059  . sodium chloride     PRN Meds:.sodium chloride, acetaminophen, hydrALAZINE, labetalol, morphine injection, nitroGLYCERIN, ondansetron (ZOFRAN) IV, sodium chloride flush   PHYSICAL EXAM: Vital signs: Vitals:   06/29/19 1130 06/29/19 1145 06/29/19 1200 06/29/19 1254  BP: 111/81 128/86 97/87   Pulse:      Resp:      Temp:      TempSrc:      SpO2: 96% 96% 97% 99%  Weight:      Height:       Filed Weights   06/28/19 2142 06/29/19 0511  Weight: (!) 136.9 kg (!) 136.5 kg   Body mass index is 37.62 kg/m.   Gen Exam:Alert awake-not in any distress HEENT:atraumatic, normocephalic Chest: B/L clear to auscultation anteriorly CVS:S1S2 regular Abdomen:soft non tender, non distended Extremities:no edema Neurology: Non focal Skin: no rash  I have personally reviewed following labs and imaging studies  LABORATORY DATA: CBC: Recent Labs  Lab 06/29/19 0644  WBC 7.8  HGB 15.6  HCT 45.4  MCV 87.1  PLT 295    Basic Metabolic Panel: Recent Labs  Lab 06/29/19 0644  NA 139  K 4.2  CL 103  CO2 26  GLUCOSE 106*  BUN 13  CREATININE 1.54*  CALCIUM 9.4    GFR: Estimated Creatinine Clearance: 90.2 mL/min (A) (by C-G formula based on SCr of 1.54 mg/dL (H)).  Liver Function Tests: No results for input(s): AST, ALT, ALKPHOS, BILITOT, PROT, ALBUMIN in the last 168 hours. No results for input(s): LIPASE, AMYLASE in the last 168 hours. No results  for input(s): AMMONIA in the last 168 hours.  Coagulation Profile: No results for input(s): INR, PROTIME in the last 168 hours.  Cardiac Enzymes: No results for input(s): CKTOTAL, CKMB, CKMBINDEX, TROPONINI in the last 168 hours.  BNP (last 3 results) No results for input(s): PROBNP in the last 8760 hours.  HbA1C: Recent Labs    06/29/19 0014  HGBA1C 6.2*    CBG: No results for input(s): GLUCAP in the last 168 hours.  Lipid Profile: No results for input(s): CHOL, HDL, LDLCALC, TRIG, CHOLHDL, LDLDIRECT in the last 72 hours.  Thyroid Function Tests: No results for input(s): TSH, T4TOTAL, FREET4, T3FREE, THYROIDAB in the last 72 hours.  Anemia Panel: No results for input(s): VITAMINB12, FOLATE, FERRITIN, TIBC, IRON, RETICCTPCT in the last 72 hours.  Urine analysis: No results found for: COLORURINE, APPEARANCEUR, LABSPEC, PHURINE, GLUCOSEU, HGBUR, BILIRUBINUR, KETONESUR, PROTEINUR, UROBILINOGEN, NITRITE, LEUKOCYTESUR  Sepsis Labs: Lactic Acid, Venous No results found for: LATICACIDVEN  MICROBIOLOGY: No results found for this or any previous visit (from the past 240 hour(s)).  RADIOLOGY STUDIES/RESULTS: No results found.   LOS: 1 day   Oren Binet, MD  [facility]  If 7PM-7AM, please contact night-coverage  Please page via [email or web address]  Go to [email or web address] and use Agency's universal password to access. If you do not have the password, please contact the hospital operator.  Locate the [facility] provider you are looking for under [facility] and page to a number that you can be directly reached. If you still have difficulty reaching the provider, please page the [facility] (Director on Call) for the Hospitalists listed on amion for assistance.  06/29/2019, 1:16 PM

## 2019-06-30 ENCOUNTER — Inpatient Hospital Stay (HOSPITAL_COMMUNITY)
Admission: AD | Disposition: A | Discharge status: Home / Self Care | Payer: Self-pay | Source: Other Acute Inpatient Hospital | Attending: Internal Medicine

## 2019-06-30 DIAGNOSIS — Z95820 Peripheral vascular angioplasty status with implants and grafts: Secondary | ICD-10-CM

## 2019-06-30 HISTORY — PX: CORONARY STENT INTERVENTION: CATH118234

## 2019-06-30 LAB — CBC
HCT: 42.3 % (ref 39.0–52.0)
Hemoglobin: 14.6 g/dL (ref 13.0–17.0)
MCH: 30.4 pg (ref 26.0–34.0)
MCHC: 34.5 g/dL (ref 30.0–36.0)
MCV: 88.1 fL (ref 80.0–100.0)
Platelets: 184 10*3/uL (ref 150–400)
RBC: 4.8 MIL/uL (ref 4.22–5.81)
RDW: 13.1 % (ref 11.5–15.5)
WBC: 9.6 10*3/uL (ref 4.0–10.5)
nRBC: 0 % (ref 0.0–0.2)

## 2019-06-30 LAB — BASIC METABOLIC PANEL
Anion gap: 12 (ref 5–15)
BUN: 14 mg/dL (ref 6–20)
CO2: 24 mmol/L (ref 22–32)
Calcium: 9 mg/dL (ref 8.9–10.3)
Chloride: 104 mmol/L (ref 98–111)
Creatinine, Ser: 1.64 mg/dL — ABNORMAL HIGH (ref 0.61–1.24)
GFR calc Af Amer: 58 mL/min — ABNORMAL LOW (ref >60)
GFR calc non Af Amer: 50 mL/min — ABNORMAL LOW (ref >60)
Glucose, Bld: 93 mg/dL (ref 70–99)
Potassium: 4 mmol/L (ref 3.5–5.1)
Sodium: 140 mmol/L (ref 135–145)

## 2019-06-30 LAB — ECHOCARDIOGRAM COMPLETE
Height: 75 in
Weight: 4816 oz

## 2019-06-30 SURGERY — CORONARY STENT INTERVENTION
Anesthesia: LOCAL

## 2019-06-30 MED ORDER — LIDOCAINE HCL (PF) 1 % IJ SOLN
INTRAMUSCULAR | Status: DC | PRN
  Administered 2019-06-30: 3 mL

## 2019-06-30 MED ORDER — SODIUM CHLORIDE 0.9% FLUSH: 3.0000 mL | INTRAVENOUS | Status: DC | PRN

## 2019-06-30 MED ORDER — MIDAZOLAM HCL 2 MG/2ML IJ SOLN
INTRAMUSCULAR | Status: AC
  Filled 2019-06-30: qty 2

## 2019-06-30 MED ORDER — VERAPAMIL HCL 2.5 MG/ML IV SOLN
INTRAVENOUS | Status: AC
  Filled 2019-06-30: qty 2

## 2019-06-30 MED ORDER — HEPARIN SODIUM (PORCINE) 5000 UNIT/ML IJ SOLN: 5000.0000 [IU] | Freq: Three times a day (TID) | INTRAMUSCULAR | Status: DC

## 2019-06-30 MED ORDER — SODIUM CHLORIDE 0.9 % IV SOLN: 250.0000 mL | INTRAVENOUS | Status: DC | PRN

## 2019-06-30 MED ORDER — SODIUM CHLORIDE 0.9 % WEIGHT BASED INFUSION: 1.0000 mL/kg/h | INTRAVENOUS | Status: DC

## 2019-06-30 MED ORDER — SODIUM CHLORIDE 0.9 % IV SOLN: INTRAVENOUS | Status: AC

## 2019-06-30 MED ORDER — HEPARIN (PORCINE) IN NACL 1000-0.9 UT/500ML-% IV SOLN
INTRAVENOUS | Status: AC
  Filled 2019-06-30: qty 1000

## 2019-06-30 MED ORDER — SODIUM CHLORIDE 0.9% FLUSH: 3.0000 mL | Freq: Two times a day (BID) | INTRAVENOUS | Status: DC

## 2019-06-30 MED ORDER — NITROGLYCERIN 1 MG/10 ML FOR IR/CATH LAB
INTRA_ARTERIAL | Status: AC
  Filled 2019-06-30: qty 10

## 2019-06-30 MED ORDER — VERAPAMIL HCL 2.5 MG/ML IV SOLN
INTRAVENOUS | Status: DC | PRN
  Administered 2019-06-30: 10 mL via INTRA_ARTERIAL

## 2019-06-30 MED ORDER — SODIUM CHLORIDE 0.9 % IV SOLN
INTRAVENOUS | Status: AC | PRN
  Administered 2019-06-30: 137 mL/h via INTRAVENOUS

## 2019-06-30 MED ORDER — FENTANYL CITRATE (PF) 100 MCG/2ML IJ SOLN
INTRAMUSCULAR | Status: DC | PRN
  Administered 2019-06-30: 50 ug via INTRAVENOUS

## 2019-06-30 MED ORDER — MIDAZOLAM HCL 2 MG/2ML IJ SOLN
INTRAMUSCULAR | Status: DC | PRN
  Administered 2019-06-30: 2 mg via INTRAVENOUS

## 2019-06-30 MED ORDER — HEPARIN SODIUM (PORCINE) 1000 UNIT/ML IJ SOLN
INTRAMUSCULAR | Status: DC | PRN
  Administered 2019-06-30: 2000 [IU] via INTRAVENOUS
  Administered 2019-06-30: 3000 [IU] via INTRAVENOUS
  Administered 2019-06-30: 12000 [IU] via INTRAVENOUS

## 2019-06-30 MED ORDER — LABETALOL HCL 5 MG/ML IV SOLN: 10.0000 mg | INTRAVENOUS | Status: AC | PRN

## 2019-06-30 MED ORDER — HEPARIN SODIUM (PORCINE) 1000 UNIT/ML IJ SOLN
INTRAMUSCULAR | Status: AC
  Filled 2019-06-30: qty 1

## 2019-06-30 MED ORDER — SODIUM CHLORIDE 0.9 % WEIGHT BASED INFUSION
3.0000 mL/kg/h | INTRAVENOUS | Status: DC
  Administered 2019-06-30: 3 mL/kg/h via INTRAVENOUS

## 2019-06-30 MED ORDER — IOHEXOL 350 MG/ML SOLN
INTRAVENOUS | Status: DC | PRN
  Administered 2019-06-30: 195 mL

## 2019-06-30 MED ORDER — HYDRALAZINE HCL 20 MG/ML IJ SOLN: 10.0000 mg | INTRAMUSCULAR | Status: AC | PRN

## 2019-06-30 MED ORDER — FENTANYL CITRATE (PF) 100 MCG/2ML IJ SOLN
INTRAMUSCULAR | Status: AC
  Filled 2019-06-30: qty 2

## 2019-06-30 MED ORDER — LIDOCAINE HCL (PF) 1 % IJ SOLN
INTRAMUSCULAR | Status: AC
  Filled 2019-06-30: qty 30

## 2019-06-30 MED ORDER — HEPARIN (PORCINE) IN NACL 1000-0.9 UT/500ML-% IV SOLN
INTRAVENOUS | Status: DC | PRN
  Administered 2019-06-30 (×2): 500 mL

## 2019-06-30 MED ORDER — SODIUM CHLORIDE 0.9% FLUSH
3.0000 mL | Freq: Two times a day (BID) | INTRAVENOUS | Status: DC
  Administered 2019-07-01: 3 mL via INTRAVENOUS

## 2019-06-30 SURGICAL SUPPLY — 21 items
BALLN SAPPHIRE 2.0X15 (BALLOONS) ×2
BALLN SAPPHIRE ~~LOC~~ 2.5X15 (BALLOONS) ×2 IMPLANT
BALLN SAPPHIRE ~~LOC~~ 3.75X15 (BALLOONS) ×2 IMPLANT
BALLN SAPPHIRE ~~LOC~~ 4.0X15 (BALLOONS) ×2 IMPLANT
BALLOON SAPPHIRE 2.0X15 (BALLOONS) ×1 IMPLANT
CATH LAUNCHER 6FR JL4 (CATHETERS) IMPLANT
CATH VISTA GUIDE 6FR JR4 (CATHETERS) ×2 IMPLANT
CATH VISTA GUIDE 6FR XBLAD3.5 (CATHETERS) ×2 IMPLANT
DEVICE RAD COMP TR BAND LRG (VASCULAR PRODUCTS) ×2 IMPLANT
GLIDESHEATH SLEND SS 6F .021 (SHEATH) ×2 IMPLANT
GUIDEWIRE INQWIRE 1.5J.035X260 (WIRE) ×1 IMPLANT
INQWIRE 1.5J .035X260CM (WIRE) ×2
KIT ENCORE 26 ADVANTAGE (KITS) ×2 IMPLANT
KIT HEART LEFT (KITS) ×2 IMPLANT
PACK CARDIAC CATHETERIZATION (CUSTOM PROCEDURE TRAY) ×2 IMPLANT
STENT RESOLUTE ONYX 2.25X30 (Permanent Stent) ×2 IMPLANT
STENT RESOLUTE ONYX 3.5X26 (Permanent Stent) ×2 IMPLANT
STENT RESOLUTE ONYX 4.0X18 (Permanent Stent) ×2 IMPLANT
TRANSDUCER W/STOPCOCK (MISCELLANEOUS) ×2 IMPLANT
TUBING CIL FLEX 10 FLL-RA (TUBING) ×2 IMPLANT
WIRE COUGAR XT STRL 190CM (WIRE) ×2 IMPLANT

## 2019-06-30 NOTE — Progress Notes (Signed)
Patient complaining of periodic shortness of breath where he can feel himself "stop breathing". Patient O2 sat at 99% on RA. Patient is new to [place]. Gave him a coke (caffeine) to help with symptoms, felt relief for a few hours, now having recurring episodes. Paged MD with info. Will continue to monitor.

## 2019-06-30 NOTE — Progress Notes (Signed)
Progress Note  Patient Name: Randall Mullen Date of Encounter: 06/30/2019  Primary Cardiologist: New--Dr. Cristal Deer  Subjective   Seen post PCI. Wife in room. Spent 40 minutes reviewing cath images, discussing medications, side effects, lifestyle, and diet. Education given, see below.  No chest pain, rare electric "twinges". Mild breathlessness with brilinta that improves with caffeine.  Inpatient Medications    Scheduled Meds: . aspirin EC  81 mg Oral Daily  . atorvastatin  40 mg Oral q1800  . febuxostat  40 mg Oral Daily  . [START ON 07/01/2019] heparin  5,000 Units Subcutaneous Q8H  . nitroGLYCERIN  1 inch Topical Q6H  . pantoprazole  40 mg Oral Q1200  . sodium chloride flush  3 mL Intravenous Q12H  . sodium chloride flush  3 mL Intravenous Q12H  . ticagrelor  90 mg Oral BID   Continuous Infusions: . sodium chloride    . sodium chloride    . sodium chloride     PRN Meds: sodium chloride, sodium chloride, acetaminophen, morphine injection, nitroGLYCERIN, ondansetron (ZOFRAN) IV, sodium chloride flush, sodium chloride flush   Vital Signs    Vitals:   06/30/19 1035 06/30/19 1040 06/30/19 1045 06/30/19 1120  BP: 133/82 135/87 140/82 128/79  Pulse: 74 69 75 (!) 59  Resp: 13 16 20    Temp:      TempSrc:      SpO2: 97% 97% 98% 97%  Weight:      Height:        Intake/Output Summary (Last 24 hours) at 06/30/2019 1923 Last data filed at 06/30/2019 1400 Gross per 24 hour  Intake 240 ml  Output -  Net 240 ml   Last 3 Weights 06/30/2019 06/29/2019 06/28/2019  Weight (lbs) 300 lb 301 lb 301 lb 14.4 oz  Weight (kg) 136.079 kg 136.533 kg 136.941 kg      Telemetry    SR with rare PVCs- Personally Reviewed  ECG    SR with ST flattening anterior, inferior, lateral and TWI lateral - Personally Reviewed  Physical Exam   GEN: Well nourished, well developed in no acute distress HEENT: Normal, moist mucous membranes NECK: No JVD CARDIAC: regular rhythm,  normal S1 and S2, no murmurs, rubs, gallops.  VASCULAR: Radial and DP pulses 2+ bilaterally. No carotid bruits RESPIRATORY:  Clear to auscultation without rales, wheezing or rhonchi  ABDOMEN: Soft, non-tender, non-distended MUSCULOSKELETAL:  Ambulates independently SKIN: Warm and dry, no edema NEUROLOGIC:  Alert and oriented x 3. No focal neuro deficits noted. PSYCHIATRIC:  Normal affect    Labs    High Sensitivity Troponin:   Recent Labs  Lab 06/28/19 2213 06/29/19 0014  TROPONINIHS 6 5      Chemistry Recent Labs  Lab 06/29/19 0644 06/30/19 0338  NA 139 140  K 4.2 4.0  CL 103 104  CO2 26 24  GLUCOSE 106* 93  BUN 13 14  CREATININE 1.54* 1.64*  CALCIUM 9.4 9.0  GFRNONAA 54* 50*  GFRAA >60 58*  ANIONGAP 10 12     Hematology Recent Labs  Lab 06/29/19 0644 06/30/19 0338  WBC 7.8 9.6  RBC 5.21 4.80  HGB 15.6 14.6  HCT 45.4 42.3  MCV 87.1 88.1  MCH 29.9 30.4  MCHC 34.4 34.5  RDW 13.2 13.1  PLT 226 184    BNP Recent Labs  Lab 06/28/19 2213  BNP 53.2     DDimer No results for input(s): DDIMER in the last 168 hours.   Radiology    No  results found.  Cardiac Studies   PCI 06/30/19  Prox LAD lesion is 99% stenosed.  Ost Cx to Prox Cx lesion is 50% stenosed.  Mid Cx to Dist Cx lesion is 100% stenosed.  Prox RCA lesion is 80% stenosed.  RPAV lesion is 99% stenosed.  A drug-eluting stent was successfully placed using a STENT RESOLUTE ONYX 3.5X26.  Post intervention, there is a 0% residual stenosis.  A drug-eluting stent was successfully placed using a STENT RESOLUTE ONYX 2.25X30.  Post intervention, there is a 0% residual stenosis.  A drug-eluting stent was successfully placed using a STENT RESOLUTE ONYX 4.0X18.  Post intervention, there is a 0% residual stenosis.   1. Successful PTCA/DES x 1 proximal LAD 2. Successful PTCA/DES x 1 posterolateral artery 3. Successful PTCA/DES x 1 proximal RCA  Will continue DAPT with ASA and Brilinta  for one year. Continue statin and beta blocker. Likely d/c home tomorrow after hydration post dye load. BMET in am.   Echo 06/29/19  1. Left ventricular ejection fraction, by visual estimation, is 55 to 60%. The left ventricle has normal function. Normal left ventricular size. There is mildly increased left ventricular hypertrophy.  2. Impaired diastolic annular velocities for age, early diastolic dysfunction.  3. Basal inferolateral segment and basal inferior segment are hypokinetic.  4. Global right ventricle has normal systolic function.The right ventricular size is normal. No increase in right ventricular wall thickness.  5. Left atrial size was moderately dilated.  6. Right atrial size was mildly dilated.  7. The mitral valve is normal in structure. No evidence of mitral valve regurgitation. No evidence of mitral stenosis.  8. The tricuspid valve is normal in structure. Tricuspid valve regurgitation was not visualized by color flow Doppler.  9. The aortic valve is normal in structure. Aortic valve regurgitation was not visualized by color flow Doppler. Structurally normal aortic valve, with no evidence of sclerosis or stenosis. 10. The pulmonic valve was normal in structure. Pulmonic valve regurgitation is not visualized by color flow Doppler. 11. Normal pulmonary artery systolic pressure. 12. The inferior vena cava is normal in size with greater than 50% respiratory variability, suggesting right atrial pressure of 3 mmHg.  Cath 06/29/19  Prox RCA lesion is 80% stenosed.  RPAV lesion is 99% stenosed.  Ost Cx to Prox Cx lesion is 50% stenosed.  Prox LAD lesion is 99% stenosed.  Mid Cx to Dist Cx lesion is 100% stenosed.   1. Severe three vessel CAD 2. Severe stenosis proximal LAD. The mid and distal LAD fills briskly from antegrade flow but also seen to fill from faint right to left collaterals.  3. Moderate disease in the proximal Circumflex artery with chronic occlusion of the mid  Circumflex just after the takeoff of the large obtuse marginal branch 4. Severe focal stenosis in the proximal segment of the large dominant RCA. Severe stenosis in the the proximal segment of the moderate caliber posterolateral artery 5. Normal filling pressures.   Recommendations: Options for treatment of multi-vessel CAD include CABG vs multi-vessel stenting. Given his young age and focal nature of the disease in his RCA and LAD, I feel that stenting is the best long term option for treatment.  I have reviewed with my interventional colleagues this am and they agree. I have discussed this with the patient and his wife and they agree. Will load with Brilinta 180 mg po x 1 today. Plan PCI of the RCA and LAD tomorrow.   Lexiscan 06/28/19 ([place]) Moderate area  of moderately decreased activity in inferolateral wall near base, reversible. Fixed small defect in anteroseptal region. EF 37%  Patient Profile     48 y.o. male with risk factors of HLD, family history CAD presented to [facility] with progressive chest pain with exertion. Transferred to [facility] for cardiac catheterization. Multivessel CAD s/p PCI.  Assessment & Plan    Unstable angina: found to have CAD on cath, now s/p PCI today -troponins negatve at [facility], negative hsTn here -lexiscan with reversible defect as well as small fixed scar.  -EF on lexiscan 37%, normal on echo -resting bradycardia, no room for beta blocker -s/p 3 stents as above. Reviewed images today.  -continue DAPT with aspirin and brilinta for 12 months -continue atorvastatin -counseled extensively on Mediterranean diet -excellent exercise -discussed PDE5 inhibitors and risk with SL NG. Ok to use PDEi but need to alert any EMS/ER to use if he has chest pain to alert them to life threatening interaction with nitrate.  CV risk factors: Hyperlipidemia: now on atorvastatin 40 mg, follow up post discharge Family history: father with 6 caths, multiple family  members with MI in late 40s/50s Obesity: BMI 1, would benefit from weight loss Impaired glucose tolerance: A1c 6.2 No tobacco use BP well controlled on no meds, no history of HTN  We discussed follow up options--he would like to follow up with me at [facility].  For questions or updates, please contact [place] Please consult [email or web address] for contact info under     Signed, Buford Dresser, MD  06/30/2019, 7:23 PM

## 2019-06-30 NOTE — Progress Notes (Signed)
PROGRESS NOTE    Randall Mullen  MEQ:[id number] DOB: 1971-09-07 DOA: 06/28/2019 PCP: Judge Stall, MD   Brief Narrative:  Patient is a 48 y.o. male 3 of HTN, GERD, gout-family history of premature CAD who presented to [facility] for substernal chest pain-found to have positive stress test at [facility] transferred to [facility] for [facility].  See below for further details.   Assessment & Plan:   Principal Problem:   Chest pain Active Problems:   HLD (hyperlipidemia)   Gout   GERD (gastroesophageal reflux disease)   Coronary artery disease involving native coronary artery of native heart with unstable angina pectoris (HCC)   Unstable angina: Chest pain free this morning-LHC on 10/14+ for multivessel disease Left heart catheter today with stent placement x3, no complications Monitor overnight check BMP for renal function in the morning given dye load Continue Brilinta/aspirin, statin.  Dyslipidemia: Continue statin  GERD: Continue PPI  Obesity: Estimated body mass index is 37.62 kg/m as calculated from the following:   Height as of this encounter: 6\' 3"  (1.905 m).   Weight as of this encounter: 136.5 kg.   DVT prophylaxis: Heparin SQ  Code Status: full    Code Status Orders  (From admission, onward)         Start     Ordered   06/28/19 2157  Full code  Continuous     06/28/19 2157        Code Status History    This patient has a current code status but no historical code status.   Advance Care Planning Activity     Family Communication: wife at bedside  Disposition Plan:   Patient remained inpatient overnight, hydration, renal function evaluation for dye load in the morning, if stable discharge home Consults called: None Admission status: Inpatient   Consultants:   cardiology  Procedures:   No results found.  Antimicrobials:   None   Subjective: Intermittent chest pain yesterday, Status post stent x3 this  morning.  Objective: Vitals:   06/30/19 1035 06/30/19 1040 06/30/19 1045 06/30/19 1120  BP: 133/82 135/87 140/82 128/79  Pulse: 74 69 75 (!) 59  Resp: 13 16 20    Temp:      TempSrc:      SpO2: 97% 97% 98% 97%  Weight:      Height:        Intake/Output Summary (Last 24 hours) at 06/30/2019 1222 Last data filed at 06/29/2019 1503 Gross per 24 hour  Intake 527 ml  Output 300 ml  Net 227 ml   Filed Weights   06/28/19 2142 06/29/19 0511 06/30/19 0648  Weight: (!) 136.9 kg (!) 136.5 kg 136.1 kg    Examination:  General exam: Appears calm and comfortable  Respiratory system: Clear to auscultation. Respiratory effort normal. Cardiovascular system: S1 & S2 heard, RRR. No JVD, murmurs, rubs, gallops or clicks. No pedal edema. Gastrointestinal system: Abdomen is nondistended, soft and nontender. No organomegaly or masses felt. Normal bowel sounds heard. Central nervous system: Alert and oriented. No focal neurological deficits. Extremities: Warm well perfused, no edema  skin: No rashes, lesions or ulcers Psychiatry: Judgement and insight appear normal. Mood & affect appropriate.     Data Reviewed: I have personally reviewed following labs and imaging studies  CBC: Recent Labs  Lab 06/29/19 0644 06/30/19 0338  WBC 7.8 9.6  HGB 15.6 14.6  HCT 45.4 42.3  MCV 87.1 88.1  PLT 226 184   Basic Metabolic Panel: Recent Labs  Lab 06/29/19 0644 06/30/19 0338  NA 139 140  K 4.2 4.0  CL 103 104  CO2 26 24  GLUCOSE 106* 93  BUN 13 14  CREATININE 1.54* 1.64*  CALCIUM 9.4 9.0   GFR: Estimated Creatinine Clearance: 84.6 mL/min (A) (by C-G formula based on SCr of 1.64 mg/dL (H)). Liver Function Tests: No results for input(s): AST, ALT, ALKPHOS, BILITOT, PROT, ALBUMIN in the last 168 hours. No results for input(s): LIPASE, AMYLASE in the last 168 hours. No results for input(s): AMMONIA in the last 168 hours. Coagulation Profile: No results for input(s): INR, PROTIME in the  last 168 hours. Cardiac Enzymes: No results for input(s): CKTOTAL, CKMB, CKMBINDEX, TROPONINI in the last 168 hours. BNP (last 3 results) No results for input(s): PROBNP in the last 8760 hours. HbA1C: Recent Labs    06/29/19 0014  HGBA1C 6.2*   CBG: No results for input(s): GLUCAP in the last 168 hours. Lipid Profile: No results for input(s): CHOL, HDL, LDLCALC, TRIG, CHOLHDL, LDLDIRECT in the last 72 hours. Thyroid Function Tests: No results for input(s): TSH, T4TOTAL, FREET4, T3FREE, THYROIDAB in the last 72 hours. Anemia Panel: No results for input(s): VITAMINB12, FOLATE, FERRITIN, TIBC, IRON, RETICCTPCT in the last 72 hours. Sepsis Labs: No results for input(s): PROCALCITON, LATICACIDVEN in the last 168 hours.  No results found for this or any previous visit (from the past 240 hour(s)).       Radiology Studies: No results found.      Scheduled Meds: . aspirin EC  81 mg Oral Daily  . atorvastatin  40 mg Oral q1800  . febuxostat  40 mg Oral Daily  . [START ON 07/01/2019] heparin  5,000 Units Subcutaneous Q8H  . nitroGLYCERIN  1 inch Topical Q6H  . pantoprazole  40 mg Oral Q1200  . sodium chloride flush  3 mL Intravenous Q12H  . sodium chloride flush  3 mL Intravenous Q12H  . ticagrelor  90 mg Oral BID   Continuous Infusions: . sodium chloride    . sodium chloride    . sodium chloride       LOS: 2 days    Time spent: 35 min    Nicolette Bang, MD [facility]  If 7PM-7AM, please contact night-coverage  06/30/2019, 12:22 PM

## 2019-06-30 NOTE — Interval H&P Note (Signed)
History and Physical Interval Note:  06/30/2019 9:13 AM  Randall Mullen  has presented today for surgery, with the diagnosis of CAD with unstable angina.  The various methods of treatment have been discussed with the patient and family. After consideration of risks, benefits and other options for treatment, the patient has consented to  Procedure(s): CORONARY STENT INTERVENTION (N/A) as a surgical intervention.  The patient's history has been reviewed, patient examined, no change in status, stable for surgery.  I have reviewed the patient's chart and labs.  Questions were answered to the patient's satisfaction.    Cath Lab Visit (complete for each Cath Lab visit)  Clinical Evaluation Leading to the Procedure:   ACS: No.  Non-ACS:    Anginal Classification: CCS III  Anti-ischemic medical therapy: No Therapy  Non-Invasive Test Results: Intermediate-risk stress test findings: cardiac mortality 1-3%/year  Prior CABG: No previous CABG         Lauree Chandler

## 2019-07-01 ENCOUNTER — Other Ambulatory Visit: Payer: Self-pay | Admitting: Physician Assistant

## 2019-07-01 ENCOUNTER — Encounter (HOSPITAL_COMMUNITY): Payer: Self-pay | Admitting: Cardiovascular Disease

## 2019-07-01 DIAGNOSIS — N179 Acute kidney failure, unspecified: Secondary | ICD-10-CM

## 2019-07-01 DIAGNOSIS — M109 Gout, unspecified: Secondary | ICD-10-CM

## 2019-07-01 LAB — BASIC METABOLIC PANEL
Anion gap: 10 (ref 5–15)
BUN: 11 mg/dL (ref 6–20)
CO2: 24 mmol/L (ref 22–32)
Calcium: 8.9 mg/dL (ref 8.9–10.3)
Chloride: 103 mmol/L (ref 98–111)
Creatinine, Ser: 1.38 mg/dL — ABNORMAL HIGH (ref 0.61–1.24)
GFR calc Af Amer: >60 mL/min (ref >60)
GFR calc non Af Amer: >60 mL/min (ref >60)
Glucose, Bld: 105 mg/dL — ABNORMAL HIGH (ref 70–99)
Potassium: 3.9 mmol/L (ref 3.5–5.1)
Sodium: 137 mmol/L (ref 135–145)

## 2019-07-01 LAB — CBC
HCT: 43.6 % (ref 39.0–52.0)
Hemoglobin: 14.8 g/dL (ref 13.0–17.0)
MCH: 29.8 pg (ref 26.0–34.0)
MCHC: 33.9 g/dL (ref 30.0–36.0)
MCV: 87.9 fL (ref 80.0–100.0)
Platelets: 220 10*3/uL (ref 150–400)
RBC: 4.96 MIL/uL (ref 4.22–5.81)
RDW: 12.9 % (ref 11.5–15.5)
WBC: 8.2 10*3/uL (ref 4.0–10.5)
nRBC: 0 % (ref 0.0–0.2)

## 2019-07-01 LAB — POCT ACTIVATED CLOTTING TIME
Activated Clotting Time: 246 seconds
Activated Clotting Time: 313 seconds
Activated Clotting Time: 571 seconds

## 2019-07-01 MED ORDER — NITROGLYCERIN 0.4 MG SL SUBL: 0.4000 mg | SUBLINGUAL_TABLET | SUBLINGUAL | 12 refills | Status: DC | PRN

## 2019-07-01 MED ORDER — ASPIRIN 81 MG PO TBEC: 81.0000 mg | DELAYED_RELEASE_TABLET | Freq: Every day | ORAL | 0 refills | Status: AC

## 2019-07-01 MED ORDER — TICAGRELOR 90 MG PO TABS: 90.0000 mg | ORAL_TABLET | Freq: Two times a day (BID) | ORAL | 0 refills | Status: AC

## 2019-07-01 MED FILL — Nitroglycerin IV Soln 100 MCG/ML in D5W: INTRA_ARTERIAL | Qty: 10 | Status: AC

## 2019-07-01 NOTE — Progress Notes (Signed)
CARDIAC REHAB PHASE I   PRE:  Rate/Rhythm: 76 SR  BP:  Supine: 121/84  Sitting:   Standing:    SaO2: 95%RA  MODE:  Ambulation: 800 ft   POST:  Rate/Rhythm: 96 SR  BP:  Supine:   Sitting: 126/103  Standing:    SaO2: 97%RA [id number] Pt walked 800 ft and stated it felt good to be walking. Denied CP. Tolerated well. BP elevated after walk and cardiology aware. Discussed NTG use, importance of brilinta with stents, walking for ex, heart healthy food choices and watching carbs with A1C of 6.2.  Pt stated his sugars have been up and down and his Primary Physician has been monitoring it. Discussed CRP 2 and referred to [facility].    [name], RN BSN  07/01/2019 9:10 AM

## 2019-07-01 NOTE — Discharge Summary (Signed)
Physician Discharge Summary  Abdulrahim Siddiqi RJJ:[id number] DOB: [birth year] DOA: 06/28/2019  PCP: Judge Stall, MD  Admit date: 06/28/2019 Discharge date: 07/01/2019  Admitted From: Inpatient Disposition: home  Recommendations for Outpatient Follow-up:  1. Follow up with PCP in 1-2 weeks 2. Please obtain BMP/CBC in one week 3. Please follow up on the following pending results:  Home Health:No Equipment/Devices:NONE  Discharge Condition:Stable CODE STATUS:Full code Diet recommendation: Cardiac diet  Brief/Interim Summary: Patient is a47 y.o.male3 of HTN, GERD, gout-family history of premature CAD who presented to [facility] for substernal chest pain-found to have positive stress test at [facility] transferred to [facility] for [facility]. See below for further details.  Hospital course Unstable angina: Chest pain free this morning-LHC on 10/14+ for multivessel disease Left heart catheter 10/15 with stent placement x3, no complications Monitored overnight with known side effect of shortness of breath with Brilinta, responsive to caffeine.  Checked BMP for renal function in the morning given dye load with creatinine of 1.38 down from 1.64, Continue Brilinta/aspirin, statin on discharge, patient with resting bradycardia no room for beta-blocker at this point  Dyslipidemia: Continue statin on discharge  GERD:Continue PPI on discharge  Gout: Hold patient's home medications, discuss further with PCP.  Obesity: Estimated body mass index is 37.62 kg/m as calculated from the following: Height as of this encounter: 6\' 3"  (1.905 m). Weight as of this encounter: 136.5 kg.  Discharge Diagnoses:  Principal Problem:   Chest pain Active Problems:   HLD (hyperlipidemia)   Gout   GERD (gastroesophageal reflux disease)   Coronary artery disease involving native coronary artery of native heart with unstable angina pectoris [facility])    Discharge  Instructions  Discharge Instructions    Amb Referral to Cardiac Rehabilitation   Complete by: As directed    Referring to [employer] CRP 2   Diagnosis: Coronary Stents   After initial evaluation and assessments completed: Virtual Based Care may be provided alone or in conjunction with Phase 2 Cardiac Rehab based on patient barriers.: Yes   Call MD for:   Complete by: As directed    Any acute change in medical condition   Diet - low sodium heart healthy   Complete by: As directed    Increase activity slowly   Complete by: As directed      Allergies as of 07/01/2019      Reactions   Amoxicillin Rash   Did it involve swelling of the face/tongue/throat, SOB, or low BP? No Did it involve sudden or severe rash/hives, skin peeling, or any reaction on the inside of your mouth or nose? Yes Did you need to seek medical attention at a hospital or doctor's office? Yes When did it last happen?2010 If all above answers are "NO", may proceed with cephalosporin use.      Medication List    TAKE these medications   aspirin 81 MG EC tablet Take 1 tablet (81 mg total) by mouth daily. Start taking on: July 02, 2019   aspirin-acetaminophen-caffeine 250-250-65 MG tablet Commonly known as: EXCEDRIN MIGRAINE Take 2 tablets by mouth every 6 (six) hours as needed for headache.   atorvastatin 40 MG tablet Commonly known as: LIPITOR Take 40 mg by mouth daily at 6 PM.   nitroGLYCERIN 0.4 MG SL tablet Commonly known as: NITROSTAT Place 1 tablet (0.4 mg total) under the tongue every 5 (five) minutes as needed for up to 15 days for chest pain.   omeprazole 40 MG capsule Commonly known as:  PRILOSEC Take 40 mg by mouth at bedtime.   TADALAFIL PO Take 9-18 mg by mouth as needed for erectile dysfunction. (chewable tablets)   ticagrelor 90 MG Tabs tablet Commonly known as: BRILINTA Take 1 tablet (90 mg total) by mouth 2 (two) times daily.   Vitamin D (Ergocalciferol) 1.25 MG (50000 UT)  Caps capsule Commonly known as: DRISDOL Take 50,000 Units by mouth every Sunday.       Allergies  Allergen Reactions  . Amoxicillin Rash    Did it involve swelling of the face/tongue/throat, SOB, or low BP? No Did it involve sudden or severe rash/hives, skin peeling, or any reaction on the inside of your mouth or nose? Yes Did you need to seek medical attention at a hospital or doctor's office? Yes When did it last happen?2010 If all above answers are "NO", may proceed with cephalosporin use.     Consultations:  cardiology   Procedures/Studies:  No results found.    Subjective: Reports doing well this morning no complications Comfortably in bed no shortness of breath Tolerates Brilinta with caffeine  Discharge Exam: Vitals:   07/01/19 0405 07/01/19 0941  BP: 122/78 (!) 131/103  Pulse: 63   Resp: 19   Temp: 98.3 F (36.8 C)   SpO2: 93% 98%   Vitals:   06/30/19 1120 06/30/19 2143 07/01/19 0405 07/01/19 0941  BP: 128/79 134/90 122/78 (!) 131/103  Pulse: (!) 59 84 63   Resp:  18 19   Temp:  98.2 F (36.8 C) 98.3 F (36.8 C)   TempSrc:  Oral Oral   SpO2: 97% 94% 93% 98%  Weight:   135.5 kg   Height:        General: Pt is alert, awake, not in acute distress Cardiovascular: RRR, S1/S2 +, no rubs, no gallops Respiratory: CTA bilaterally, no wheezing, no rhonchi Abdominal: Soft, NT, ND, bowel sounds + Extremities: no edema, no cyanosis    The results of significant diagnostics from this hospitalization (including imaging, microbiology, ancillary and laboratory) are listed below for reference.     Microbiology: No results found for this or any previous visit (from the past 240 hour(s)).   Labs: BNP (last 3 results) Recent Labs    06/28/19 2213  BNP 62.8   Basic Metabolic Panel: Recent Labs  Lab 06/29/19 0644 06/30/19 0338 07/01/19 0258  NA 139 140 137  K 4.2 4.0 3.9  CL 103 104 103  CO2 26 24 24   GLUCOSE 106* 93 105*  BUN 13 14 11    CREATININE 1.54* 1.64* 1.38*  CALCIUM 9.4 9.0 8.9   Liver Function Tests: No results for input(s): AST, ALT, ALKPHOS, BILITOT, PROT, ALBUMIN in the last 168 hours. No results for input(s): LIPASE, AMYLASE in the last 168 hours. No results for input(s): AMMONIA in the last 168 hours. CBC: Recent Labs  Lab 06/29/19 0644 06/30/19 0338 07/01/19 0258  WBC 7.8 9.6 8.2  HGB 15.6 14.6 14.8  HCT 45.4 42.3 43.6  MCV 87.1 88.1 87.9  PLT 226 184 220   Cardiac Enzymes: No results for input(s): CKTOTAL, CKMB, CKMBINDEX, TROPONINI in the last 168 hours. BNP: Invalid input(s): POCBNP CBG: No results for input(s): GLUCAP in the last 168 hours. D-Dimer No results for input(s): DDIMER in the last 72 hours. Hgb A1c Recent Labs    06/29/19 0014  HGBA1C 6.2*   Lipid Profile No results for input(s): CHOL, HDL, LDLCALC, TRIG, CHOLHDL, LDLDIRECT in the last 72 hours. Thyroid function studies No results for  input(s): TSH, T4TOTAL, T3FREE, THYROIDAB in the last 72 hours.  Invalid input(s): FREET3 Anemia work up No results for input(s): VITAMINB12, FOLATE, FERRITIN, TIBC, IRON, RETICCTPCT in the last 72 hours. Urinalysis No results found for: COLORURINE, APPEARANCEUR, LABSPEC, PHURINE, GLUCOSEU, HGBUR, BILIRUBINUR, KETONESUR, PROTEINUR, UROBILINOGEN, NITRITE, LEUKOCYTESUR Sepsis Labs Invalid input(s): PROCALCITONIN,  WBC,  LACTICIDVEN Microbiology No results found for this or any previous visit (from the past 240 hour(s)).   Time coordinating discharge: Over 30 minutes  SIGNED:   Burke Keelshristopher Maurisio Ruddy, MD  [facility] 07/01/2019, 9:53 AM Pager   If 7PM-7AM, please contact night-coverage [email or web address] Password [email or web address]

## 2019-07-01 NOTE — Progress Notes (Signed)
Progress Note  Patient Name: Randall Mullen Date of Encounter: 07/01/2019  Primary Cardiologist: New--Dr. Cristal Deerhristopher  Subjective   Doing well this AM. Breathlessness last night gone with caffeine after brilinta dosing. No chest pain.  Spent time reviewing meds, plan, diet, exercise, post cath monitoring. All questions answered.  Inpatient Medications    Scheduled Meds: . aspirin EC  81 mg Oral Daily  . atorvastatin  40 mg Oral q1800  . febuxostat  40 mg Oral Daily  . heparin  5,000 Units Subcutaneous Q8H  . nitroGLYCERIN  1 inch Topical Q6H  . pantoprazole  40 mg Oral Q1200  . sodium chloride flush  3 mL Intravenous Q12H  . sodium chloride flush  3 mL Intravenous Q12H  . ticagrelor  90 mg Oral BID   Continuous Infusions: . sodium chloride    . sodium chloride     PRN Meds: sodium chloride, sodium chloride, acetaminophen, morphine injection, nitroGLYCERIN, ondansetron (ZOFRAN) IV, sodium chloride flush, sodium chloride flush   Vital Signs    Vitals:   06/30/19 1045 06/30/19 1120 06/30/19 2143 07/01/19 0405  BP: 140/82 128/79 134/90 122/78  Pulse: 75 (!) 59 84 63  Resp: 20  18 19   Temp:   98.2 F (36.8 C) 98.3 F (36.8 C)  TempSrc:   Oral Oral  SpO2: 98% 97% 94% 93%  Weight:    135.5 kg  Height:        Intake/Output Summary (Last 24 hours) at 07/01/2019 0809 Last data filed at 06/30/2019 2200 Gross per 24 hour  Intake 900 ml  Output -  Net 900 ml   Last 3 Weights 07/01/2019 06/30/2019 06/29/2019  Weight (lbs) 298 lb 11.6 oz 300 lb 301 lb  Weight (kg) 135.5 kg 136.079 kg 136.533 kg      Telemetry    NSR- Personally Reviewed  ECG    ECG today: SR with ST flattening anterior, inferior, lateral and TWI lateral - Personally Reviewed  Physical Exam   GEN: Well nourished, well developed in no acute distress HEENT: Normal, moist mucous membranes NECK: No JVD CARDIAC: regular rhythm, normal S1 and S2, no murmurs, rubs, gallops. Radial site c/d/i  VASCULAR: Radial and DP pulses 2+ bilaterally. No carotid bruits RESPIRATORY:  Clear to auscultation without rales, wheezing or rhonchi  ABDOMEN: Soft, non-tender, non-distended MUSCULOSKELETAL:  Ambulates independently SKIN: Warm and dry, no edema NEUROLOGIC:  Alert and oriented x 3. No focal neuro deficits noted. PSYCHIATRIC:  Normal affect   Labs    High Sensitivity Troponin:   Recent Labs  Lab 06/28/19 2213 06/29/19 0014  TROPONINIHS 6 5      Chemistry Recent Labs  Lab 06/29/19 0644 06/30/19 0338 07/01/19 0258  NA 139 140 137  K 4.2 4.0 3.9  CL 103 104 103  CO2 26 24 24   GLUCOSE 106* 93 105*  BUN 13 14 11   CREATININE 1.54* 1.64* 1.38*  CALCIUM 9.4 9.0 8.9  GFRNONAA 54* 50* >60  GFRAA >60 58* >60  ANIONGAP 10 12 10      Hematology Recent Labs  Lab 06/29/19 0644 06/30/19 0338 07/01/19 0258  WBC 7.8 9.6 8.2  RBC 5.21 4.80 4.96  HGB 15.6 14.6 14.8  HCT 45.4 42.3 43.6  MCV 87.1 88.1 87.9  MCH 29.9 30.4 29.8  MCHC 34.4 34.5 33.9  RDW 13.2 13.1 12.9  PLT 226 184 220    BNP Recent Labs  Lab 06/28/19 2213  BNP 53.2     DDimer No results for  input(s): DDIMER in the last 168 hours.   Radiology    No results found.  Cardiac Studies   PCI 06/30/19  Prox LAD lesion is 99% stenosed.  Ost Cx to Prox Cx lesion is 50% stenosed.  Mid Cx to Dist Cx lesion is 100% stenosed.  Prox RCA lesion is 80% stenosed.  RPAV lesion is 99% stenosed.  A drug-eluting stent was successfully placed using a STENT RESOLUTE ONYX 3.5X26.  Post intervention, there is a 0% residual stenosis.  A drug-eluting stent was successfully placed using a STENT RESOLUTE ONYX 2.25X30.  Post intervention, there is a 0% residual stenosis.  A drug-eluting stent was successfully placed using a STENT RESOLUTE ONYX 4.0X18.  Post intervention, there is a 0% residual stenosis.   1. Successful PTCA/DES x 1 proximal LAD 2. Successful PTCA/DES x 1 posterolateral artery 3. Successful  PTCA/DES x 1 proximal RCA  Will continue DAPT with ASA and Brilinta for one year. Continue statin and beta blocker. Likely d/c home tomorrow after hydration post dye load. BMET in am.   Echo 06/29/19  1. Left ventricular ejection fraction, by visual estimation, is 55 to 60%. The left ventricle has normal function. Normal left ventricular size. There is mildly increased left ventricular hypertrophy.  2. Impaired diastolic annular velocities for age, early diastolic dysfunction.  3. Basal inferolateral segment and basal inferior segment are hypokinetic.  4. Global right ventricle has normal systolic function.The right ventricular size is normal. No increase in right ventricular wall thickness.  5. Left atrial size was moderately dilated.  6. Right atrial size was mildly dilated.  7. The mitral valve is normal in structure. No evidence of mitral valve regurgitation. No evidence of mitral stenosis.  8. The tricuspid valve is normal in structure. Tricuspid valve regurgitation was not visualized by color flow Doppler.  9. The aortic valve is normal in structure. Aortic valve regurgitation was not visualized by color flow Doppler. Structurally normal aortic valve, with no evidence of sclerosis or stenosis. 10. The pulmonic valve was normal in structure. Pulmonic valve regurgitation is not visualized by color flow Doppler. 11. Normal pulmonary artery systolic pressure. 12. The inferior vena cava is normal in size with greater than 50% respiratory variability, suggesting right atrial pressure of 3 mmHg.  Cath 06/29/19  Prox RCA lesion is 80% stenosed.  RPAV lesion is 99% stenosed.  Ost Cx to Prox Cx lesion is 50% stenosed.  Prox LAD lesion is 99% stenosed.  Mid Cx to Dist Cx lesion is 100% stenosed.   1. Severe three vessel CAD 2. Severe stenosis proximal LAD. The mid and distal LAD fills briskly from antegrade flow but also seen to fill from faint right to left collaterals.  3. 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 Severe focal stenosis in the proximal segment of the large dominant RCA. Severe stenosis in the the proximal segment of the moderate caliber posterolateral artery 5. Normal filling pressures.   Recommendations: Options for treatment of multi-vessel CAD include CABG vs multi-vessel stenting. Given his young age and focal nature of the disease in his RCA and LAD, I feel that stenting is the best long term option for treatment.  I have reviewed with my interventional colleagues this am and they agree. I have discussed this with the patient and his wife and they agree. Will load with Brilinta 180 mg po x 1 today. Plan  PCI of the RCA and LAD tomorrow.   Lexiscan 06/28/19 ([place]) Moderate area of moderately decreased activity in inferolateral wall near base, reversible. Fixed small defect in anteroseptal region. EF 37%  Patient Profile     48 y.o. male with risk factors of HLD, family history CAD presented to [facility] with progressive chest pain with exertion. Transferred to [facility] for cardiac catheterization. Multivessel CAD s/p PCI.  Assessment & Plan    Unstable angina: found to have CAD on cath, now s/p PCI 10/15. Doing well this AM, ready for discharge -troponins negatve at [facility], negative hsTn here -lexiscan with reversible defect as well as small fixed scar.  -EF on lexiscan 37%, normal on echo -resting bradycardia, no room for beta blocker (though slightly higher today--monitor on follow up) -s/p 3 stents as above. Reviewed images today.  -continue DAPT with aspirin and brilinta for 12 months -continue atorvastatin -counseled on diet, exercise -discussed PDE5 inhibitors and risk with SL NG. Ok to use PDEi but need to alert any EMS/ER to use if he has chest pain to alert them to life threatening interaction with nitrate.   Abnormal kidney function: I do not have prior labs to know if this was acute kidney injury or chronic kidney disease, but creatinine improved with hydration today -check BMET at follow up  Gout: I do not know of interactions with his medications, but I would recommend checking with pharmacist. I am aware of higher risk of CV death with febuxostat but unclear if there is clear contraindication. We discussed and he would be ok with holding and discussing with his PCP.  CV risk factors: Hyperlipidemia: now on atorvastatin 40 mg, follow up post discharge Family history: father with 6 caths, multiple family members with MI in late 40s/50s Obesity: BMI 37, would benefit from weight loss Impaired glucose tolerance: A1c 6.2 No tobacco use BP well controlled on no meds, no history of HTN Unclear acute or chronic kidney disease  [facility] will sign off.   Medication Recommendations:  As ordered (aspirin, ticagrelor, atorvastatin) Other recommendations (labs, testing, etc):  BMET post discharge Follow up as an outpatient:  We will arrange for him to follow up with me at [facility]. 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  For questions or updates, please contact [facility] Please consult [email or web address] for contact info under     Signed, Jodelle Red, MD  07/01/2019, 8:09 AM

## 2019-07-08 ENCOUNTER — Telehealth: Payer: Self-pay | Admitting: Cardiology

## 2019-07-08 NOTE — Telephone Encounter (Signed)
New Message    Patient states he's suppose to go through cardiac rehab and wants to know when will it start.  Please call patient back to disucss.

## 2019-07-08 NOTE — Telephone Encounter (Signed)
Called patient- advised that referral was placed, that office would call them to get set up- patient verbalized understanding.   Patient also asking about blood work- and if it is needed- it was ordered, but no due date; I advised I would ask MD.

## 2019-07-11 NOTE — Telephone Encounter (Signed)
Pt updated and voiced understanding.  

## 2019-07-11 NOTE — Telephone Encounter (Signed)
If he can get the bloodwork done before his appt on 11/6 that would be helpful (BMET is ordered). Thanks.

## 2019-07-22 ENCOUNTER — Ambulatory Visit: Payer: BC Managed Care – PPO | Admitting: Cardiology

## 2019-07-22 ENCOUNTER — Other Ambulatory Visit: Payer: Self-pay

## 2019-07-22 ENCOUNTER — Encounter: Payer: Self-pay | Admitting: Cardiology

## 2019-07-22 VITALS — BP 132/96 | HR 97 | Ht 75.0 in | Wt 296.6 lb

## 2019-07-22 DIAGNOSIS — Z09 Encounter for follow-up examination after completed treatment for conditions other than malignant neoplasm: Secondary | ICD-10-CM

## 2019-07-22 DIAGNOSIS — E781 Pure hyperglyceridemia: Secondary | ICD-10-CM | POA: Diagnosis not present

## 2019-07-22 DIAGNOSIS — Z7189 Other specified counseling: Secondary | ICD-10-CM

## 2019-07-22 DIAGNOSIS — I25110 Atherosclerotic heart disease of native coronary artery with unstable angina pectoris: Secondary | ICD-10-CM | POA: Diagnosis not present

## 2019-07-22 DIAGNOSIS — Z79899 Other long term (current) drug therapy: Secondary | ICD-10-CM

## 2019-07-22 NOTE — Patient Instructions (Addendum)
Medication Instructions:  Your Physician recommend you continue on your current medication as directed.    *If you need a refill on your cardiac medications before your next appointment, please call your pharmacy*  Lab Work: Your physician recommends that you return for lab work today (BMP) and Lipid in 3 months.  If you have labs (blood work) drawn today and your tests are completely normal, you will receive your results only by: Marland Kitchen MyChart Message (if you have MyChart) OR . A paper copy in the mail If you have any lab test that is abnormal or we need to change your treatment, we will call you to review the results.  Testing/Procedures: None  Follow-Up: At [facility], you and your health needs are our priority.  As part of our continuing mission to provide you with exceptional heart care, we have created designated Provider Care Teams.  These Care Teams include your primary Cardiologist (physician) and Advanced Practice Providers (APPs -  Physician Assistants and Nurse Practitioners) who all work together to provide you with the care you need, when you need it.  Your next appointment:   6 months  The format for your next appointment:   In Person  Provider:   Buford Dresser, MD

## 2019-07-22 NOTE — Progress Notes (Signed)
Cardiology Office Note:    Date:  07/22/2019   ID:  Randall Mullen, DOB 1971-01-05, MRN [id number]  PCP:  Judge Stall, MD  Cardiologist:  Jodelle Red, MD  Referring MD: Judge Stall, MD   CC: post hospital follow up  History of Present Illness:    Randall Mullen is a 48 y.o. male with a hx of hyperlipidemia, strong family history of CAD, recent multivessel CAD s/p stents who is seen for post hospital follow up.  Today: Doing very well since leaving the hospital. Initially had some rare chest pinches after leaving, but none recently. Tolerating brilinta, better when he drinks a coke with it. Tolerating other meds as well. Discussed exercise/activity, monitoring cath site.  Discussed cardiac rehab. He is making excellent changes on diet, exercise, lifestyle independently. He does not think he can commit to 12 weeks of 3 days/week for cardiac rehab. He is eating fish, very little carbs, no salt. Working on getting back to the gym.  Avoiding pre-workout supplements.  We reviewed diet, lifestyle, and medications at length today. Discussed red flag warning signs that need immediate medical attention.  Past Medical History:  Diagnosis Date  . GERD (gastroesophageal reflux disease)   . Gout   . HLD (hyperlipidemia)     Past Surgical History:  Procedure Laterality Date  . CORONARY STENT INTERVENTION N/A 06/30/2019   Procedure: CORONARY STENT INTERVENTION;  Surgeon: Kathleene Hazel, MD;  Location: [facility] INVASIVE CV [facility];  Service: Cardiovascular;  Laterality: N/A;  . LEFT HEART CATH AND CORONARY ANGIOGRAPHY N/A 06/29/2019   Procedure: LEFT HEART CATH AND CORONARY ANGIOGRAPHY;  Surgeon: Kathleene Hazel, MD;  Location: [facility] INVASIVE CV LAB;  Service: Cardiovascular;  Laterality: N/A;  . VENTRAL HERNIA REPAIR      Current Medications: Current Outpatient Medications on File Prior to Visit  Medication Sig  . aspirin EC 81 MG EC tablet Take 1 tablet (81 mg  total) by mouth daily.  Marland Kitchen atorvastatin (LIPITOR) 40 MG tablet Take 40 mg by mouth daily at 6 PM.  . omeprazole (PRILOSEC) 40 MG capsule Take 40 mg by mouth at bedtime.  Marland Kitchen TADALAFIL PO Take 9-18 mg by mouth as needed for erectile dysfunction. (chewable tablets)  . ticagrelor (BRILINTA) 90 MG TABS tablet Take 1 tablet (90 mg total) by mouth 2 (two) times daily.  . Vitamin D, Ergocalciferol, (DRISDOL) 1.25 MG (50000 UT) CAPS capsule Take 50,000 Units by mouth every Sunday.   . nitroGLYCERIN (NITROSTAT) 0.4 MG SL tablet Place 1 tablet (0.4 mg total) under the tongue every 5 (five) minutes as needed for up to 15 days for chest pain.   No current facility-administered medications on file prior to visit.      Allergies:   Amoxicillin   Social History   Tobacco Use  . Smoking status: Never Smoker  . Smokeless tobacco: Never Used  Substance Use Topics  . Alcohol use: Yes  . Drug use: Never    Family History: family history includes CAD in his father; Heart attack in his paternal grandmother.  ROS:   Please see the history of present illness.  Additional pertinent ROS: Constitutional: Negative for chills, fever, night sweats, unintentional weight loss  HENT: Negative for ear pain and hearing loss.   Eyes: Negative for loss of vision and eye pain.  Respiratory: Negative for cough, sputum, wheezing.   Cardiovascular: See HPI. Gastrointestinal: Negative for abdominal pain, melena, and hematochezia.  Genitourinary: Negative for dysuria and hematuria.  Musculoskeletal: Negative for falls  and myalgias.  Skin: Negative for itching and rash.  Neurological: Negative for focal weakness, focal sensory changes and loss of consciousness.  Endo/Heme/Allergies: Does not bruise/bleed easily.     EKGs/Labs/Other Studies Reviewed:    The following studies were reviewed today: PCI 06/30/19  Prox LAD lesion is 99% stenosed.  Ost Cx to Prox Cx lesion is 50% stenosed.  Mid Cx to Dist Cx lesion is  100% stenosed.  Prox RCA lesion is 80% stenosed.  RPAV lesion is 99% stenosed.  A drug-eluting stent was successfully placed using a STENT RESOLUTE ONYX 3.5X26.  Post intervention, there is a 0% residual stenosis.  A drug-eluting stent was successfully placed using a STENT RESOLUTE ONYX 2.25X30.  Post intervention, there is a 0% residual stenosis.  A drug-eluting stent was successfully placed using a STENT RESOLUTE ONYX 4.0X18.  Post intervention, there is a 0% residual stenosis.  1. Successful PTCA/DES x 1 proximal LAD 2. Successful PTCA/DES x 1 posterolateral artery 3. Successful PTCA/DES x 1 proximal RCA  Will continue DAPT with ASA and Brilinta for one year. Continue statin and beta blocker. Likely d/c home tomorrow after hydration post dye load. BMET in am.   Echo 06/29/19 1. Left ventricular ejection fraction, by visual estimation, is 55 to 60%. The left ventricle has normal function. Normal left ventricular size. There is mildly increased left ventricular hypertrophy. 2. Impaired diastolic annular velocities for age, early diastolic dysfunction. 3. Basal inferolateral segment and basal inferior segment are hypokinetic. 4. Global right ventricle has normal systolic function.The right ventricular size is normal. No increase in right ventricular wall thickness. 5. Left atrial size was moderately dilated. 6. Right atrial size was mildly dilated. 7. The mitral valve is normal in structure. No evidence of mitral valve regurgitation. No evidence of mitral stenosis. 8. The tricuspid valve is normal in structure. Tricuspid valve regurgitation was not visualized by color flow Doppler. 9. The aortic valve is normal in structure. Aortic valve regurgitation was not visualized by color flow Doppler. Structurally normal aortic valve, with no evidence of sclerosis or stenosis. 10. The pulmonic valve was normal in structure. Pulmonic valve regurgitation is not visualized by color  flow Doppler. 11. Normal pulmonary artery systolic pressure. 12. The inferior vena cava is normal in size with greater than 50% respiratory variability, suggesting right atrial pressure of 3 mmHg.  Cath 06/29/19  Prox RCA lesion is 80% stenosed.  RPAV lesion is 99% stenosed.  Ost Cx to Prox Cx lesion is 50% stenosed.  Prox LAD lesion is 99% stenosed.  Mid Cx to Dist Cx lesion is 100% stenosed.  1. Severe three vessel CAD 2. Severe stenosis proximal LAD. The mid and distal LAD fills briskly from antegrade flow but also seen to fill from faint right to left collaterals.  3. Moderate disease in the proximal Circumflex artery with chronic occlusion of the mid Circumflex just after the takeoff of the large obtuse marginal branch 4. Severe focal stenosis in the proximal segment of the large dominant RCA. Severe stenosis in the the proximal segment of the moderate caliber posterolateral artery 5. Normal filling pressures.   Recommendations: Options for treatment of multi-vessel CAD include CABG vs multi-vessel stenting. Given his young age and focal nature of the disease in his RCA and LAD, I feel that stenting is the best long term option for treatment. I have reviewed with my interventional colleagues this am and they agree. I have discussed this with the patient and his wife and they agree.  Will load with Brilinta 180 mg po x 1 today. Plan PCI of the RCA and LAD tomorrow.   Lexiscan 06/28/19 ([place]) Moderate area of moderately decreased activity in inferolateral wall near base, reversible. Fixed small defect in anteroseptal region. EF 37%  EKG:  EKG is personally reviewed.  The ekg ordered 07/01/19 demonstrates NSR with anterolateral TWI  Recent Labs: 06/28/2019: B Natriuretic Peptide 53.2 07/01/2019: BUN 11; Creatinine, Ser 1.38; Hemoglobin 14.8; Platelets 220; Potassium 3.9; Sodium 137  Recent Lipid Panel No results found for: CHOL, TRIG, HDL, CHOLHDL, VLDL, LDLCALC, LDLDIRECT   Lipids from [place] 06/2019: Triglycerides 209, LDL 64, HDL 31, total cholesterol 409  Physical Exam:    VS:  BP (!) 132/96   Pulse 97   Ht  (1.905 m)   Wt 296 lb 9.6 oz (134.5 kg)   SpO2 98%   BMI 37.07 kg/m     Wt Readings from Last 3 Encounters:  07/22/19 296 lb 9.6 oz (134.5 kg)  07/01/19 298 lb 11.6 oz (135.5 kg)    GEN: Well nourished, well developed in no acute distress HEENT: Normal, moist mucous membranes NECK: No JVD CARDIAC: regular rhythm, normal S1 and S2, no rubs or gallops. No murmurs. VASCULAR: Radial and DP pulses 2+ bilaterally. No carotid bruits RESPIRATORY:  Clear to auscultation without rales, wheezing or rhonchi  ABDOMEN: Soft, non-tender, non-distended MUSCULOSKELETAL:  Ambulates independently SKIN: Warm and dry, no edema NEUROLOGIC:  Alert and oriented x 3. No focal neuro deficits noted. PSYCHIATRIC:  Normal affect    ASSESSMENT:    1. Hospital discharge follow-up   2. Medication management   3. Coronary artery disease involving native coronary artery of native heart with unstable angina pectoris (HCC)   4. Pure hypertriglyceridemia   5. Cardiac risk counseling   6. Counseling on health promotion and disease prevention    PLAN:    3V CAD (found 2/2 unstable angina) s/p PCI 10/15. -continue DAPT with aspirin and brilinta for 12 months -continue atorvastatin -counseled on diet, exercise -discussed PDE5 inhibitors and risk with SL NG. Ok to use PDEi but need to alert any EMS/ER to use if he has chest pain to alert them to life threatening interaction with nitrate. -normal EF -discussed cardiac rehab. He declines, encouraged him to continue doing diet/lifestyle/exercise changes at home. -beta blocker had not been started in the hospital due to bradycardia.  Abnormal kidney function in the hospital:  -check BMET today  Hyperlipidemia: Mainly triglycerides, though question given his severe disease if he also has something like elevation  lipoprotein (a). Now on atorvastatin 40 mg, follow up 3 mos  Family history: father with 6 caths, multiple family members with MI in late 40s/50s Obesity: BMI 37, would benefit from weight loss Impaired glucose tolerance: A1c 6.2 No tobacco use BP well controlled on no meds, no history of HTN  Cardiac risk counseling and prevention recommendations: -recommend heart healthy/Mediterranean diet, with whole grains, fruits, vegetable, fish, lean meats, nuts, and olive oil. Limit salt. -recommend moderate walking, 3-5 times/week for 30-50 minutes each session. Aim for at least 150 minutes.week. Goal should be pace of 3 miles/hours, or walking 1.5 miles in 30 minutes -recommend avoidance of tobacco products. Avoid excess alcohol.  Plan for follow up: lipids in 3 mos, appt in 6 mos  Medication Adjustments/Labs and Tests Ordered: Current medicines are reviewed at length with the patient today.  Concerns regarding medicines are outlined above.  Orders Placed This Encounter  Procedures  . Basic  metabolic panel  . Lipid panel   No orders of the defined types were placed in this encounter.   Patient Instructions  Medication Instructions:  Your Physician recommend you continue on your current medication as directed.    *If you need a refill on your cardiac medications before your next appointment, please call your pharmacy*  Lab Work: Your physician recommends that you return for lab work today (BMP) and Lipid in 3 months.  If you have labs (blood work) drawn today and your tests are completely normal, you will receive your results only by: Marland Kitchen MyChart Message (if you have MyChart) OR . A paper copy in the mail If you have any lab test that is abnormal or we need to change your treatment, we will call you to review the results.  Testing/Procedures: None  Follow-Up: At [facility], you and your health needs are our priority.  As part of our continuing mission to provide you with  exceptional heart care, we have created designated Provider Care Teams.  These Care Teams include your primary Cardiologist (physician) and Advanced Practice Providers (APPs -  Physician Assistants and Nurse Practitioners) who all work together to provide you with the care you need, when you need it.  Your next appointment:   6 months  The format for your next appointment:   In Person  Provider:   Buford Dresser, MD     Signed, Buford Dresser, MD PhD 07/22/2019 12:40 PM    [place]

## 2019-07-23 LAB — BASIC METABOLIC PANEL
BUN/Creatinine Ratio: 12 (ref 9–20)
BUN: 14 mg/dL (ref 6–24)
CO2: 22 mmol/L (ref 20–29)
Calcium: 9.3 mg/dL (ref 8.7–10.2)
Chloride: 103 mmol/L (ref 96–106)
Creatinine, Ser: 1.15 mg/dL (ref 0.76–1.27)
GFR calc Af Amer: 88 mL/min/{1.73_m2} (ref >59)
GFR calc non Af Amer: 76 mL/min/{1.73_m2} (ref >59)
Glucose: 95 mg/dL (ref 65–99)
Potassium: 4.3 mmol/L (ref 3.5–5.2)
Sodium: 142 mmol/L (ref 134–144)

## 2019-07-26 ENCOUNTER — Telehealth: Payer: Self-pay | Admitting: Cardiology

## 2019-07-26 NOTE — Telephone Encounter (Signed)
Pt updated with lab results and voiced understanding. 

## 2019-07-26 NOTE — Telephone Encounter (Signed)
New message ° ° °Patient is returning call for lab results. Please call. °

## 2019-07-26 NOTE — Telephone Encounter (Signed)
Pt calling back

## 2019-07-29 ENCOUNTER — Telehealth: Payer: Self-pay

## 2019-07-29 NOTE — Telephone Encounter (Signed)
Spoke to patient about recent email.Stated his son was sent home from college due to being around some one with covid.Stated PCP will do a covid test this Mon,but they wanted to know if someone could check him now and could a rapid result test be done.Advised I don't know if the rapid test are available at present.Advised he could have done at [facility],but it would take 7 to 10 days to get results.Stated he will have done with PCP Mon 08/01/19.

## 2019-08-01 MED ORDER — TICAGRELOR 90 MG PO TABS: 90.0000 mg | ORAL_TABLET | Freq: Two times a day (BID) | ORAL | 3 refills | Status: DC

## 2019-08-02 ENCOUNTER — Telehealth: Payer: Self-pay | Admitting: Cardiology

## 2019-08-02 NOTE — Telephone Encounter (Signed)
Patient sent Dr. Harrell Gave a MyChart message earlier today. He went to get a Covid Test and had an irregular heartbeat. The patient had an EKG done and sent a picture of that to Dr. Harrell Gave in his MyChart message. He wanted to have Dr. Harrell Gave evaluate the EKG.

## 2019-08-04 NOTE — Telephone Encounter (Signed)
See my chart encounter.

## 2019-08-15 ENCOUNTER — Emergency Department (HOSPITAL_COMMUNITY): Payer: BC Managed Care – PPO

## 2019-08-15 ENCOUNTER — Inpatient Hospital Stay (HOSPITAL_COMMUNITY)
Admission: EM | Admit: 2019-08-15 | Discharge: 2019-08-17 | DRG: 193 | Disposition: A | Discharge status: Home / Self Care | Payer: BC Managed Care – PPO | Attending: Internal Medicine | Admitting: Internal Medicine

## 2019-08-15 ENCOUNTER — Other Ambulatory Visit: Payer: Self-pay

## 2019-08-15 DIAGNOSIS — J189 Pneumonia, unspecified organism: Secondary | ICD-10-CM | POA: Diagnosis present

## 2019-08-15 DIAGNOSIS — Z6833 Body mass index (BMI) 33.0-33.9, adult: Secondary | ICD-10-CM

## 2019-08-15 DIAGNOSIS — Z881 Allergy status to other antibiotic agents status: Secondary | ICD-10-CM | POA: Diagnosis not present

## 2019-08-15 DIAGNOSIS — M109 Gout, unspecified: Secondary | ICD-10-CM | POA: Diagnosis present

## 2019-08-15 DIAGNOSIS — Z20828 Contact with and (suspected) exposure to other viral communicable diseases: Secondary | ICD-10-CM | POA: Diagnosis present

## 2019-08-15 DIAGNOSIS — Z8249 Family history of ischemic heart disease and other diseases of the circulatory system: Secondary | ICD-10-CM

## 2019-08-15 DIAGNOSIS — E669 Obesity, unspecified: Secondary | ICD-10-CM | POA: Diagnosis present

## 2019-08-15 DIAGNOSIS — K219 Gastro-esophageal reflux disease without esophagitis: Secondary | ICD-10-CM | POA: Diagnosis present

## 2019-08-15 DIAGNOSIS — Z79899 Other long term (current) drug therapy: Secondary | ICD-10-CM

## 2019-08-15 DIAGNOSIS — Z955 Presence of coronary angioplasty implant and graft: Secondary | ICD-10-CM | POA: Diagnosis not present

## 2019-08-15 DIAGNOSIS — E785 Hyperlipidemia, unspecified: Secondary | ICD-10-CM | POA: Diagnosis present

## 2019-08-15 DIAGNOSIS — I2510 Atherosclerotic heart disease of native coronary artery without angina pectoris: Secondary | ICD-10-CM | POA: Diagnosis present

## 2019-08-15 DIAGNOSIS — J9601 Acute respiratory failure with hypoxia: Secondary | ICD-10-CM | POA: Diagnosis present

## 2019-08-15 DIAGNOSIS — R9431 Abnormal electrocardiogram [ECG] [EKG]: Secondary | ICD-10-CM | POA: Diagnosis not present

## 2019-08-15 DIAGNOSIS — Z20822 Contact with and (suspected) exposure to covid-19: Secondary | ICD-10-CM

## 2019-08-15 LAB — POC SARS CORONAVIRUS 2 AG -  ED
SARS Coronavirus 2 Ag: NEGATIVE
SARS Coronavirus 2 Ag: NEGATIVE

## 2019-08-15 LAB — COMPREHENSIVE METABOLIC PANEL
ALT: 32 U/L (ref 0–44)
AST: 30 U/L (ref 15–41)
Albumin: 3.3 g/dL — ABNORMAL LOW (ref 3.5–5.0)
Alkaline Phosphatase: 30 U/L — ABNORMAL LOW (ref 38–126)
Anion gap: 12 (ref 5–15)
BUN: 12 mg/dL (ref 6–20)
CO2: 25 mmol/L (ref 22–32)
Calcium: 8.7 mg/dL — ABNORMAL LOW (ref 8.9–10.3)
Chloride: 102 mmol/L (ref 98–111)
Creatinine, Ser: 1.2 mg/dL (ref 0.61–1.24)
GFR calc Af Amer: >60 mL/min (ref >60)
GFR calc non Af Amer: >60 mL/min (ref >60)
Glucose, Bld: 128 mg/dL — ABNORMAL HIGH (ref 70–99)
Potassium: 3.6 mmol/L (ref 3.5–5.1)
Sodium: 139 mmol/L (ref 135–145)
Total Bilirubin: 0.6 mg/dL (ref 0.3–1.2)
Total Protein: 7.1 g/dL (ref 6.5–8.1)

## 2019-08-15 LAB — URINALYSIS, ROUTINE W REFLEX MICROSCOPIC
Bacteria, UA: NONE SEEN
Bilirubin Urine: NEGATIVE
Glucose, UA: NEGATIVE mg/dL
Hgb urine dipstick: NEGATIVE
Ketones, ur: NEGATIVE mg/dL
Leukocytes,Ua: NEGATIVE
Nitrite: NEGATIVE
Protein, ur: 30 mg/dL — AB
Specific Gravity, Urine: 1.025 (ref 1.005–1.030)
pH: 5 (ref 5.0–8.0)

## 2019-08-15 LAB — CBC WITH DIFFERENTIAL/PLATELET
Abs Immature Granulocytes: 0.03 10*3/uL (ref 0.00–0.07)
Basophils Absolute: 0 10*3/uL (ref 0.0–0.1)
Basophils Relative: 0 %
Eosinophils Absolute: 0 10*3/uL (ref 0.0–0.5)
Eosinophils Relative: 0 %
HCT: 44.7 % (ref 39.0–52.0)
Hemoglobin: 14.9 g/dL (ref 13.0–17.0)
Immature Granulocytes: 0 %
Lymphocytes Relative: 12 %
Lymphs Abs: 1 10*3/uL (ref 0.7–4.0)
MCH: 29.3 pg (ref 26.0–34.0)
MCHC: 33.3 g/dL (ref 30.0–36.0)
MCV: 88 fL (ref 80.0–100.0)
Monocytes Absolute: 0.5 10*3/uL (ref 0.1–1.0)
Monocytes Relative: 6 %
Neutro Abs: 6.6 10*3/uL (ref 1.7–7.7)
Neutrophils Relative %: 82 %
Platelets: 262 10*3/uL (ref 150–400)
RBC: 5.08 MIL/uL (ref 4.22–5.81)
RDW: 12.5 % (ref 11.5–15.5)
WBC: 8 10*3/uL (ref 4.0–10.5)
nRBC: 0 % (ref 0.0–0.2)

## 2019-08-15 LAB — BRAIN NATRIURETIC PEPTIDE: B Natriuretic Peptide: 27.5 pg/mL (ref 0.0–100.0)

## 2019-08-15 LAB — PROCALCITONIN: Procalcitonin: <0.1 ng/mL

## 2019-08-15 LAB — LACTATE DEHYDROGENASE: LDH: 271 U/L — ABNORMAL HIGH (ref 98–192)

## 2019-08-15 LAB — TROPONIN I (HIGH SENSITIVITY)
Troponin I (High Sensitivity): 12 ng/L (ref <18)
Troponin I (High Sensitivity): 9 ng/L (ref <18)

## 2019-08-15 LAB — PROTIME-INR
INR: 1.1 (ref 0.8–1.2)
Prothrombin Time: 13.8 seconds (ref 11.4–15.2)

## 2019-08-15 LAB — FERRITIN: Ferritin: 409 ng/mL — ABNORMAL HIGH (ref 24–336)

## 2019-08-15 LAB — FIBRINOGEN: Fibrinogen: 658 mg/dL — ABNORMAL HIGH (ref 210–475)

## 2019-08-15 LAB — D-DIMER, QUANTITATIVE: D-Dimer, Quant: 1.09 ug/mL-FEU — ABNORMAL HIGH (ref 0.00–0.50)

## 2019-08-15 LAB — C-REACTIVE PROTEIN: CRP: 5.4 mg/dL — ABNORMAL HIGH (ref <1.0)

## 2019-08-15 LAB — LACTIC ACID, PLASMA
Lactic Acid, Venous: 2.1 mmol/L (ref 0.5–1.9)
Lactic Acid, Venous: 1.9 mmol/L (ref 0.5–1.9)

## 2019-08-15 LAB — SARS CORONAVIRUS 2 (TAT 6-24 HRS): SARS Coronavirus 2: NEGATIVE

## 2019-08-15 MED ORDER — ASPIRIN EC 81 MG PO TBEC
81.0000 mg | DELAYED_RELEASE_TABLET | Freq: Every day | ORAL | Status: DC
  Administered 2019-08-16 – 2019-08-17 (×2): 81 mg via ORAL
  Filled 2019-08-15 (×2): qty 1

## 2019-08-15 MED ORDER — SODIUM CHLORIDE 0.9 % IV BOLUS (SEPSIS)
1000.0000 mL | Freq: Once | INTRAVENOUS | Status: AC
  Administered 2019-08-15: 1000 mL via INTRAVENOUS

## 2019-08-15 MED ORDER — ACETAMINOPHEN 325 MG PO TABS: 650.0000 mg | ORAL_TABLET | Freq: Four times a day (QID) | ORAL | Status: DC | PRN

## 2019-08-15 MED ORDER — ONDANSETRON HCL 4 MG/2ML IJ SOLN: 4.0000 mg | Freq: Four times a day (QID) | INTRAMUSCULAR | Status: DC | PRN

## 2019-08-15 MED ORDER — ALBUTEROL SULFATE HFA 108 (90 BASE) MCG/ACT IN AERS
2.0000 | INHALATION_SPRAY | RESPIRATORY_TRACT | Status: DC | PRN
  Administered 2019-08-15: 2 via RESPIRATORY_TRACT
  Filled 2019-08-15: qty 6.7

## 2019-08-15 MED ORDER — SODIUM CHLORIDE 0.9 % IV SOLN
250.0000 mL | INTRAVENOUS | Status: DC | PRN
  Administered 2019-08-16: 250 mL via INTRAVENOUS

## 2019-08-15 MED ORDER — ACETAMINOPHEN 500 MG PO TABS
1000.0000 mg | ORAL_TABLET | Freq: Once | ORAL | Status: AC
  Administered 2019-08-15: 1000 mg via ORAL
  Filled 2019-08-15: qty 2

## 2019-08-15 MED ORDER — BISACODYL 5 MG PO TBEC: 5.0000 mg | DELAYED_RELEASE_TABLET | Freq: Every day | ORAL | Status: DC | PRN

## 2019-08-15 MED ORDER — SODIUM CHLORIDE 0.9 % IV SOLN
2.0000 g | INTRAVENOUS | Status: DC
  Administered 2019-08-16 – 2019-08-17 (×2): 2 g via INTRAVENOUS
  Filled 2019-08-15: qty 2
  Filled 2019-08-15 (×2): qty 20

## 2019-08-15 MED ORDER — SODIUM CHLORIDE 0.9% FLUSH
3.0000 mL | Freq: Once | INTRAVENOUS | Status: AC
  Administered 2019-08-15: 3 mL via INTRAVENOUS

## 2019-08-15 MED ORDER — TICAGRELOR 90 MG PO TABS
90.0000 mg | ORAL_TABLET | Freq: Two times a day (BID) | ORAL | Status: DC
  Administered 2019-08-15 – 2019-08-17 (×5): 90 mg via ORAL
  Filled 2019-08-15 (×5): qty 1

## 2019-08-15 MED ORDER — SODIUM CHLORIDE 0.9 % IV SOLN
500.0000 mg | Freq: Once | INTRAVENOUS | Status: AC
  Administered 2019-08-15: 500 mg via INTRAVENOUS
  Filled 2019-08-15: qty 500

## 2019-08-15 MED ORDER — ENOXAPARIN SODIUM 40 MG/0.4ML ~~LOC~~ SOLN
40.0000 mg | Freq: Every day | SUBCUTANEOUS | Status: DC
  Administered 2019-08-16 – 2019-08-17 (×2): 40 mg via SUBCUTANEOUS
  Filled 2019-08-15 (×2): qty 0.4

## 2019-08-15 MED ORDER — METHYLPREDNISOLONE SODIUM SUCC 125 MG IJ SOLR
0.5000 mg/kg | Freq: Two times a day (BID) | INTRAMUSCULAR | Status: DC
  Administered 2019-08-15 – 2019-08-17 (×5): 61.25 mg via INTRAVENOUS
  Filled 2019-08-15 (×5): qty 2

## 2019-08-15 MED ORDER — SODIUM CHLORIDE 0.9% FLUSH: 3.0000 mL | INTRAVENOUS | Status: DC | PRN

## 2019-08-15 MED ORDER — SODIUM CHLORIDE 0.9% FLUSH
3.0000 mL | Freq: Two times a day (BID) | INTRAVENOUS | Status: DC
  Administered 2019-08-15 – 2019-08-17 (×5): 3 mL via INTRAVENOUS

## 2019-08-15 MED ORDER — POLYETHYLENE GLYCOL 3350 17 G PO PACK: 17.0000 g | PACK | Freq: Every day | ORAL | Status: DC | PRN

## 2019-08-15 MED ORDER — ATORVASTATIN CALCIUM 40 MG PO TABS
40.0000 mg | ORAL_TABLET | Freq: Every day | ORAL | Status: DC
  Administered 2019-08-15 – 2019-08-16 (×2): 40 mg via ORAL
  Filled 2019-08-15 (×2): qty 1

## 2019-08-15 MED ORDER — SODIUM CHLORIDE 0.9 % IV SOLN
500.0000 mg | Freq: Once | INTRAVENOUS | Status: AC
  Administered 2019-08-16: 500 mg via INTRAVENOUS
  Filled 2019-08-15: qty 500

## 2019-08-15 MED ORDER — SODIUM CHLORIDE 0.9 % IV SOLN
2.0000 g | Freq: Once | INTRAVENOUS | Status: AC
  Administered 2019-08-15: 03:00:00 2 g via INTRAVENOUS
  Filled 2019-08-15: qty 20

## 2019-08-15 MED ORDER — DOCUSATE SODIUM 100 MG PO CAPS
100.0000 mg | ORAL_CAPSULE | Freq: Two times a day (BID) | ORAL | Status: DC
  Administered 2019-08-16: 100 mg via ORAL
  Filled 2019-08-15 (×5): qty 1

## 2019-08-15 MED ORDER — ONDANSETRON HCL 4 MG PO TABS: 4.0000 mg | ORAL_TABLET | Freq: Four times a day (QID) | ORAL | Status: DC | PRN

## 2019-08-15 MED ORDER — OXYCODONE HCL 5 MG PO TABS: 5.0000 mg | ORAL_TABLET | ORAL | Status: DC | PRN

## 2019-08-15 MED ORDER — PANTOPRAZOLE SODIUM 40 MG PO TBEC
40.0000 mg | DELAYED_RELEASE_TABLET | Freq: Every day | ORAL | Status: DC
  Administered 2019-08-15 – 2019-08-17 (×3): 40 mg via ORAL
  Filled 2019-08-15 (×3): qty 1

## 2019-08-15 NOTE — ED Notes (Signed)
Ordered diet tray 

## 2019-08-15 NOTE — H&P (Signed)
History and Physical    Randall Mullen RCV:[id number] DOB: 1971/05/11 DOA: 08/15/2019  PCP: Judge Stall, MD Consultants:  [facility] - cardiology Patient coming from:  Home - lives with wife and 2 sons; NOK: Wife, Randall Mullen, [phone]  Chief Complaint: SOB  HPI: Randall Mullen is a 48 y.o. male with medical history significant of HLD; gout; CAD s/p stent in Oct 2020; and GERD presenting with SOB. His son was sent home from college after having a positive COVID contact.  His wife and sons tested positive on 11/13 and he started developing symptoms.  He was tested on 11/17 but was negative.  He has continued to have fever, cough, SOB.  Symptoms worsened today and he returned for further evaluation.     ED Course:  Carryover, per Dr. Julian Reil:  48 yo M with probable COVID, 2 family members tested positive on 13th. Borderline O2 sats on RA now 90-92%. CXR shows B infiltrates. Fever 102.x. Got rocephin and azithro in ED.   Antigen is negative in ED, PCR is pending.   Review of Systems: As per HPI; otherwise review of systems reviewed and negative.   Ambulatory Status:  Ambulates without assistance  Past Medical History:  Diagnosis Date  . GERD (gastroesophageal reflux disease)   . Gout   . HLD (hyperlipidemia)     Past Surgical History:  Procedure Laterality Date  . CORONARY STENT INTERVENTION N/A 06/30/2019   Procedure: CORONARY STENT INTERVENTION;  Surgeon: Kathleene Hazel, MD;  Location: [facility];  Service: Cardiovascular;  Laterality: N/A;  . LEFT HEART CATH AND CORONARY ANGIOGRAPHY N/A 06/29/2019   Procedure: LEFT HEART CATH AND CORONARY ANGIOGRAPHY;  Surgeon: Kathleene Hazel, MD;  Location: [facility];  Service: Cardiovascular;  Laterality: N/A;  . VENTRAL HERNIA REPAIR      Social History   Socioeconomic History  . Marital status: Married    Spouse name: Not on file  . Number of children: Not on file  . Years of  education: Not on file  . Highest education level: Not on file  Occupational History  . Not on file  Social Needs  . Financial resource strain: Not on file  . Food insecurity    Worry: Not on file    Inability: Not on file  . Transportation needs    Medical: Not on file    Non-medical: Not on file  Tobacco Use  . Smoking status: Never Smoker  . Smokeless tobacco: Never Used  Substance and Sexual Activity  . Alcohol use: Yes  . Drug use: Never  . Sexual activity: Not on file  Lifestyle  . Physical activity    Days per week: Not on file    Minutes per session: Not on file  . Stress: Not on file  Relationships  . Social Musician on phone: Not on file    Gets together: Not on file    Attends religious service: Not on file    Active member of club or organization: Not on file    Attends meetings of clubs or organizations: Not on file    Relationship status: Married  . Intimate partner violence    Fear of current or ex partner: Not on file    Emotionally abused: Not on file    Physically abused: Not on file    Forced sexual activity: Not on file  Other Topics Concern  . Not on file  Social History Narrative  . Not on  file    Allergies  Allergen Reactions  . Amoxicillin Rash    Did it involve swelling of the face/tongue/throat, SOB, or low BP? No Did it involve sudden or severe rash/hives, skin peeling, or any reaction on the inside of your mouth or nose? Yes Did you need to seek medical attention at a hospital or doctor's office? Yes When did it last happen?2010 If all above answers are "NO", may proceed with cephalosporin use.     Family History  Problem Relation Age of Onset  . CAD Father   . Heart attack Paternal Grandmother     Prior to Admission medications   Medication Sig Start Date End Date Taking? Authorizing Provider  atorvastatin (LIPITOR) 40 MG tablet Take 40 mg by mouth daily at 6 PM. 06/18/19   [provider]   nitroGLYCERIN (NITROSTAT) 0.4 MG SL tablet Place 1 tablet (0.4 mg total) under the tongue every 5 (five) minutes as needed for up to 15 days for chest pain. 07/01/19 07/16/19  Marzetta BoardSpongberg, Christopher N, MD  omeprazole (PRILOSEC) 40 MG capsule Take 40 mg by mouth at bedtime. 05/23/19   [provider]  TADALAFIL PO Take 9-18 mg by mouth as needed for erectile dysfunction. (chewable tablets) 05/20/19   [provider]  ticagrelor (BRILINTA) 90 MG TABS tablet Take 1 tablet (90 mg total) by mouth 2 (two) times daily. 08/01/19   Jodelle Redhristopher, Bridgette, MD  Vitamin D, Ergocalciferol, (DRISDOL) 1.25 MG (50000 UT) CAPS capsule Take 50,000 Units by mouth every Sunday.  05/15/19   [provider]    Physical Exam: Vitals:   08/15/19 1030 08/15/19 1126 08/15/19 1130 08/15/19 1514  BP: 128/79 126/79 129/73 114/74  Pulse: 83 71 78 74  Resp: (!) 34 (!) 23 (!) 24 (!) 29  Temp:  97.6 F (36.4 C)    TempSrc:  Oral    SpO2: 93% 95% 95% 96%  Weight:      Height:         . General:  Appears ill . Eyes:  PERRL, EOMI, normal lids, iris . ENT:  grossly normal hearing, lips & tongue, mmm; appropriate dentition . Neck:  no LAD, masses or thyromegaly . Cardiovascular:  RR with mild tachycardia, no m/r/g. No LE edema.  Marland Kitchen. Respiratory:   Scattered rhonchi.  Mildly increased respiratory effort. . Abdomen:  soft, NT, ND, NABS . Back:   normal alignment, no CVAT . Skin:  no rash or induration seen on limited exam . Musculoskeletal:  grossly normal tone BUE/BLE, good ROM, no bony abnormality . Psychiatric:  grossly normal mood and affect, speech fluent and appropriate, AOx3 . Neurologic:  CN 2-12 grossly intact, moves all extremities in coordinated fashion, sensation intact    Radiological Exams on Admission: Dg Chest Portable 1 View  Result Date: 08/15/2019 CLINICAL DATA:  Shortness of breath, fever EXAM: PORTABLE CHEST 1 VIEW COMPARISON:  06/27/2019 FINDINGS: Patchy bilateral airspace  opacities within the lungs. Low lung volumes. Heart is normal size. No effusions or acute bony abnormality. IMPRESSION: Patchy bilateral airspace opacities concerning for pneumonia. Electronically Signed   By: Charlett NoseKevin  Dover M.D.   On: 08/15/2019 02:20    EKG: Independently reviewed.  Sinus tachycardia with rate 105; nonspecific ST changes with no evidence of acute ischemia   Labs on Admission: I have personally reviewed the available labs and imaging studies at the time of the admission.  Pertinent labs:   Glucose 128 Lactate 2.1, 1.9 Normal CBC BD COVID negative; Hologic  COVID pending UA: 30 protein Blood cultures pending HS troponin 12, 9 Ferritin 409 CRP 5.4 Procalcitonin <0.10 D-dimer 1.09 Fibrinogen 658 INR 1.1 UA: 30 protein Urine culture pending   Assessment/Plan Active Problems:   Multifocal pneumonia   Acute respiratory failure with hypoxia -Patient with presenting with SOB and persistent fever, with known COVID contacts in his household -He has lost his sense of taste > smell -He does not have a usual home O2 requirement and is currently requiring 2L [place] O2 -Because there is a possible COVID contact in this patient with a new O2 requirement, will consider at high risk for COVID at this time - despite multiple negative tests -Pertinent labs concerning for COVID include normal WBC count; low procalcitonin; markedly elevated D-dimer (>1); elevated CRP (but not >7);  increased LDH; increased ferritin; increased fibrinogen -CXR with multifocal opacities which may be c/w COVID vs. Multifocal PNA -Will treat with broad-spectrum antibiotics despite procalcitonin <0.1 given negative COVID test -Will [facility] to [facility] for further evaluation, close monitoring, and treatment -Monitor on telemetry x at least 24 hours -Will check for influenza -At this time, will attempt to avoid use of aerosolized medications and use HFAs instead -Will check daily labs including BMP with Mag, Phos;  LFTs; CBC with differential; CRP; ferritin; fibrinogen; D-dimer  -Will order steroids (1 mg/kg divided BID)  -Cannot start Remdesivir despite high index of suspicion given negative COVID testing -Could consider repeat testing in the AM, although there seems to be such little chance that he does not have COVID-19 infection despite multiple negative tests -If the patient shows clinical deterioration, consider transfer to ICU with PCCM consultation -Will attempt to maintain euvolemia to a net negative fluid status -Will ask the patient to maintain an awake prone position for 16+ hours a day, if possible, with a minimum of 2-3 hours at a time -With D-dimer <5, will use standard-dosed Lovenox for DVT prevention -Patient was seen wearing full PPE including: gown, gloves, head cover, N95, and face shield; donning and doffing was in compliance with current standards.  CAD -Recent stent placement (Oct) -Continue ASA and Brilinta -Continue Lipitor  Obesity -BMI 33.75 -Weight loss should be encouraged -Outpatient PCP/bariatric medicine/bariatric surgery f/u encouraged    DVT prophylaxis:  Lovenox  Code Status:  Full - confirmed with patient Family Communication: None present Disposition Plan:  Home once clinically improved Consults called: None  Admission status: Admit - It is my clinical opinion that admission to INPATIENT is reasonable and necessary because of the expectation that this patient will require hospital care that crosses at least 2 midnights to treat this condition based on the medical complexity of the problems presented.  Given the aforementioned information, the predictability of an adverse outcome is felt to be significant.      Karmen Bongo MD [facility]   How to contact the [facility] Attending or Consulting provider [place] or covering provider during after hours [place], for this patient?  1. Check the care team in [facility] and look for a) attending/consulting [facility]  provider listed and b) the [facility] team listed 2. Log into [email or web address] and use [place]'s universal password to access. If you do not have the password, please contact the hospital operator. 3. Locate the [facility] provider you are looking for under [facility] and page to a number that you can be directly reached. 4. If you still have difficulty reaching the provider, please page the [facility] (Director on Call) for the Hospitalists listed  on amion for assistance.   08/15/2019, 5:24 PM

## 2019-08-15 NOTE — Significant Event (Signed)
Patient high risk COVID with Wife and Son tested positive. Admitted febrile with hypoxia. First Test Negative, will re-test and add RVP. This was agreed on as well by Dr. Anson Fret

## 2019-08-15 NOTE — ED Notes (Signed)
ED TO INPATIENT HANDOFF REPORT  ED Nurse Name and Phone #: Lovell Sheehan [phone]  S Name/Age/Gender Randall Mullen 48 y.o. male Room/Bed: 025C/025C  Code Status   Code Status: Full Code  Home/SNF/Other Home Patient oriented to: self, place, time and situation Is this baseline? Yes   Triage Complete: Triage complete  Chief Complaint [facility]  Triage Note Pt to ED with c/o shortness of breath and fever.  Pt st's his wife and sons tested positive for covid on11/13  Pt st's he then began to feel bad and was tested on 11/17 pt st's his test came back neg. But he continued to feel bad.  Pt st's fever has been up and down, now c/o shortness of breath.  Pt st's he took Tylenol 1535m at 01:00 and temp remains at 102.9   Allergies Allergies  Allergen Reactions  . Amoxicillin Rash    Did it involve swelling of the face/tongue/throat, SOB, or low BP? No Did it involve sudden or severe rash/hives, skin peeling, or any reaction on the inside of your mouth or nose? Yes Did you need to seek medical attention at a hospital or doctor's office? Yes When did it last happen?2010 If all above answers are "NO", may proceed with cephalosporin use.     Level of Care/Admitting Diagnosis ED Disposition    ED Disposition Condition Comment   Admit  H[facility]: [facility][[id number]]  Level of Care: Telemetry Medical [104]  Covid Evaluation: Symptomatic Person Under Investigation (PUI)  Diagnosis: Multifocal pneumonia [[[id number]] Admitting Physician: YKarmen Bongo[[id number]]  Attending Physician: YKarmen Bongo[[id number]]  Estimated length of stay: 5 - 7 days  Certification:: I certify this patient will need inpatient services for at least 2 midnights  PT Class (Do Not Modify): Inpatient [101]  PT Acc Code (Do Not Modify): Private [1]       B Medical/Surgery History Past Medical History:  Diagnosis Date  . GERD (gastroesophageal reflux disease)   . Gout   . HLD  (hyperlipidemia)    Past Surgical History:  Procedure Laterality Date  . CORONARY STENT INTERVENTION N/A 06/30/2019   Procedure: CORONARY STENT INTERVENTION;  Surgeon: MBurnell Blanks MD;  [facility];  Service: Cardiovascular;  Laterality: N/A;  . LEFT HEART CATH AND CORONARY ANGIOGRAPHY N/A 06/29/2019   Procedure: LEFT HEART CATH AND CORONARY ANGIOGRAPHY;  Surgeon: MBurnell Blanks MD;  [facility];  Service: Cardiovascular;  Laterality: N/A;  . VENTRAL HERNIA REPAIR       A IV Location/Drains/Wounds Patient Lines/Drains/Airways Status   Active Line/Drains/Airways    Name:   Placement date:   Placement time:   Site:   Days:   Peripheral IV 08/15/19 Right Antecubital   08/15/19    0242    Antecubital   less than 1          Intake/Output Last 24 hours  Intake/Output Summary (Last 24 hours) at 08/15/2019 2324 Last data filed at 08/15/2019 1820 Gross per 24 hour  Intake 4356 ml  Output 4450 ml  Net -94 ml    Labs/Imaging Results for orders placed or performed during the hospital encounter of 08/15/19 (from the past 48 hour(s))  Lactic acid, plasma     Status: Abnormal   Collection Time: 08/15/19  2:02 AM  Result Value Ref Range   Lactic Acid, Venous 2.1 (HH) 0.5 - 1.9 mmol/L    Comment: CRITICAL RESULT CALLED TO, READ BACK BY AND VERIFIED WITH:  T CAMPBELL,RN 08/15/2019 0506 WILDERK Performed at [facility] [address], [place], [place] [address]   Comprehensive metabolic panel     Status: Abnormal   Collection Time: 08/15/19  2:02 AM  Result Value Ref Range   Sodium 139 135 - 145 mmol/L   Potassium 3.6 3.5 - 5.1 mmol/L   Chloride 102 98 - 111 mmol/L   CO2 25 22 - 32 mmol/L   Glucose, Bld 128 (H) 70 - 99 mg/dL   BUN 12 6 - 20 mg/dL   Creatinine, Ser 1.20 0.61 - 1.24 mg/dL   Calcium 8.7 (L) 8.9 - 10.3 mg/dL   Total Protein 7.1 6.5 - 8.1 g/dL   Albumin 3.3 (L) 3.5 - 5.0 g/dL   AST 30 15 - 41 U/L   ALT 32  0 - 44 U/L   Alkaline Phosphatase 30 (L) 38 - 126 U/L   Total Bilirubin 0.6 0.3 - 1.2 mg/dL   GFR calc non Af Amer >60 >60 mL/min   GFR calc Af Amer >60 >60 mL/min   Anion gap 12 5 - 15    Comment: Performed at [facility] [address] [place], [place] [address]  CBC with Differential     Status: None   Collection Time: 08/15/19  2:02 AM  Result Value Ref Range   WBC 8.0 4.0 - 10.5 K/uL   RBC 5.08 4.22 - 5.81 MIL/uL   Hemoglobin 14.9 13.0 - 17.0 g/dL   HCT 44.7 39.0 - 52.0 %   MCV 88.0 80.0 - 100.0 fL   MCH 29.3 26.0 - 34.0 pg   MCHC 33.3 30.0 - 36.0 g/dL   RDW 12.5 11.5 - 15.5 %   Platelets 262 150 - 400 K/uL   nRBC 0.0 0.0 - 0.2 %   Neutrophils Relative % 82 %   Neutro Abs 6.6 1.7 - 7.7 K/uL   Lymphocytes Relative 12 %   Lymphs Abs 1.0 0.7 - 4.0 K/uL   Monocytes Relative 6 %   Monocytes Absolute 0.5 0.1 - 1.0 K/uL   Eosinophils Relative 0 %   Eosinophils Absolute 0.0 0.0 - 0.5 K/uL   Basophils Relative 0 %   Basophils Absolute 0.0 0.0 - 0.1 K/uL   Immature Granulocytes 0 %   Abs Immature Granulocytes 0.03 0.00 - 0.07 K/uL    Comment: Performed at [facility], [address] [place], [place] [address]  POC SARS Coronavirus 2 Ag-ED - Nasal Swab (BD Veritor Kit)     Status: None   Collection Time: 08/15/19  3:35 AM  Result Value Ref Range   SARS Coronavirus 2 Ag NEGATIVE NEGATIVE    Comment: (NOTE) SARS-CoV-2 antigen NOT DETECTED.  Negative results are presumptive.  Negative results do not preclude SARS-CoV-2 infection and should not be used as the sole basis for treatment or other patient management decisions, including infection  control decisions, particularly in the presence of clinical signs and  symptoms consistent with COVID-19, or in those who have been in contact with the virus.  Negative results must be combined with clinical observations, patient history, and epidemiological information. The expected result is Negative. Fact Sheet for  Patients: PodPark.tn Fact Sheet for Healthcare Providers: GiftContent.is This test is not yet approved or cleared by the [place] FDA and  has been authorized for detection and/or diagnosis of SARS-CoV-2 by FDA under an Emergency Use Authorization (EUA).  This EUA will remain in effect (meaning this test can  be used) for the duration of  the COVID-19 de claration under Section 564(b)(1) of the Act, 21 U.S.C. section 360bbb-3(b)(1), unless the authorization is terminated or revoked sooner.   SARS CORONAVIRUS 2 (TAT 6-24 HRS) Nasopharyngeal Nasopharyngeal Swab     Status: None   Collection Time: 08/15/19  3:45 AM   Specimen: Nasopharyngeal Swab  Result Value Ref Range   SARS Coronavirus 2 NEGATIVE NEGATIVE    Comment: (NOTE) SARS-CoV-2 target nucleic acids are NOT DETECTED. The SARS-CoV-2 RNA is generally detectable in upper and lower respiratory specimens during the acute phase of infection. Negative results do not preclude SARS-CoV-2 infection, do not rule out co-infections with other pathogens, and should not be used as the sole basis for treatment or other patient management decisions. Negative results must be combined with clinical observations, patient history, and epidemiological information. The expected result is Negative. Fact Sheet for Patients: SugarRoll.be Fact Sheet for Healthcare Providers: https://www.woods-mathews.com/ This test is not yet approved or cleared by the [place] FDA and  has been authorized for detection and/or diagnosis of SARS-CoV-2 by FDA under an Emergency Use Authorization (EUA). This EUA will remain  in effect (meaning this test can be used) for the duration of the COVID-19 declaration under Section 56 4(b)(1) of the Act, 21 U.S.C. section 360bbb-3(b)(1), unless the authorization is terminated or revoked sooner. Performed at [facility], [place] [address], [place], [place] [address]   Lactic acid, plasma     Status: None   Collection Time: 08/15/19  4:02 AM  Result Value Ref Range   Lactic Acid, Venous 1.9 0.5 - 1.9 mmol/L    Comment: Performed at [facility] [address], [place], [place] [address]  Urinalysis, Routine w reflex microscopic     Status: Abnormal   Collection Time: 08/15/19  4:08 AM  Result Value Ref Range   Color, Urine YELLOW YELLOW   APPearance CLEAR CLEAR   Specific Gravity, Urine 1.025 1.005 - 1.030   pH 5.0 5.0 - 8.0   Glucose, UA NEGATIVE NEGATIVE mg/dL   Hgb urine dipstick NEGATIVE NEGATIVE   Bilirubin Urine NEGATIVE NEGATIVE   Ketones, ur NEGATIVE NEGATIVE mg/dL   Protein, ur 30 (A) NEGATIVE mg/dL   Nitrite NEGATIVE NEGATIVE   Leukocytes,Ua NEGATIVE NEGATIVE   RBC / HPF 0-5 0 - 5 RBC/hpf   WBC, UA 0-5 0 - 5 WBC/hpf   Bacteria, UA NONE SEEN NONE SEEN   Mucus PRESENT     Comment: Performed at [facility] [address] [place], [place] [address]  Brain natriuretic peptide     Status: None   Collection Time: 08/15/19  8:43 AM  Result Value Ref Range   B Natriuretic Peptide 27.5 0.0 - 100.0 pg/mL    Comment: Performed at [facility] [address], [place], [place] [address]  C-reactive protein     Status: Abnormal   Collection Time: 08/15/19  9:48 AM  Result Value Ref Range   CRP 5.4 (H) <1.0 mg/dL    Comment: Performed at [facility] [address] [place], [place] [address]  D-dimer, quantitative (not at [facility])     Status: Abnormal   Collection Time: 08/15/19  9:48 AM  Result Value Ref Range   D-Dimer, Quant 1.09 (H) 0.00 - 0.50 ug/mL-FEU    Comment: (NOTE) At the manufacturer cut-off of 0.50 ug/mL FEU, this assay has been documented to exclude PE with a sensitivity and negative  predictive value of 97 to 99%.  At this time, this assay has not been approved by the FDA to exclude DVT/VTE. Results should be correlated with clinical  presentation. Performed at [facility], [place] [address] [place], [place] [address]   Ferritin     Status: Abnormal   Collection Time: 08/15/19  9:48 AM  Result Value Ref Range   Ferritin 409 (H) 24 - 336 ng/mL    Comment: Performed at [facility], [place] [address], [place], [place] [address]  Fibrinogen     Status: Abnormal   Collection Time: 08/15/19  9:48 AM  Result Value Ref Range   Fibrinogen 658 (H) 210 - 475 mg/dL    Comment: Performed at [facility] [address] [place], [place] [address]  Lactate dehydrogenase     Status: Abnormal   Collection Time: 08/15/19  9:48 AM  Result Value Ref Range   LDH 271 (H) 98 - 192 U/L    Comment: Performed at [facility] [address] [place], [place] [address]  Procalcitonin     Status: None   Collection Time: 08/15/19  9:48 AM  Result Value Ref Range   Procalcitonin <0.10 ng/mL    Comment:        Interpretation: PCT (Procalcitonin) <= 0.5 ng/mL: Systemic infection (sepsis) is not likely. Local bacterial infection is possible. (NOTE)       Sepsis PCT Algorithm           Lower Respiratory Tract                                      Infection PCT Algorithm    ----------------------------     ----------------------------         PCT < 0.25 ng/mL                PCT < 0.10 ng/mL         Strongly encourage             Strongly discourage   discontinuation of antibiotics    initiation of antibiotics    ----------------------------     -----------------------------       PCT 0.25 - 0.50 ng/mL            PCT 0.10 - 0.25 ng/mL               OR       >80% decrease in PCT            Discourage initiation of                                            antibiotics      Encourage discontinuation           of antibiotics    ----------------------------     -----------------------------         PCT >= 0.50 ng/mL              PCT 0.26 - 0.50 ng/mL               AND        <80% decrease in PCT             Encourage  initiation of  antibiotics       Encourage continuation           of antibiotics    ----------------------------     -----------------------------        PCT >= 0.50 ng/mL                  PCT > 0.50 ng/mL               AND         increase in PCT                  Strongly encourage                                      initiation of antibiotics    Strongly encourage escalation           of antibiotics                                     -----------------------------                                           PCT <= 0.25 ng/mL                                                 OR                                        > 80% decrease in PCT                                     Discontinue / Do not initiate                                             antibiotics Performed at [facility], [address], [place], [place] [address]   Troponin I (High Sensitivity)     Status: None   Collection Time: 08/15/19  9:48 AM  Result Value Ref Range   Troponin I (High Sensitivity) 12 <18 ng/L    Comment: (NOTE) Elevated high sensitivity troponin I (hsTnI) values and significant  changes across serial measurements may suggest ACS but many other  chronic and acute conditions are known to elevate hsTnI results.  Refer to the "Links" section for chest pain algorithms and additional  guidance. Performed at [facility], [place] [address], [place], [place] [address]   Protime-INR     Status: None   Collection Time: 08/15/19  9:48 AM  Result Value Ref Range   Prothrombin Time 13.8 11.4 - 15.2 seconds   INR 1.1 0.8 - 1.2    Comment: (NOTE) INR goal varies based on device and disease states. Performed at [facility], [place],  [place] [address]   Troponin I (High Sensitivity)     Status: None   Collection Time: 08/15/19 12:05 PM  Result Value Ref Range   Troponin I (High Sensitivity) 9 <18 ng/L    Comment: (NOTE) Elevated  high sensitivity troponin I (hsTnI) values and significant  changes across serial measurements may suggest ACS but many other  chronic and acute conditions are known to elevate hsTnI results.  Refer to the "Links" section for chest pain algorithms and additional  guidance. Performed at [facility], [place] [address] [place], [place] [address]    Dg Chest Portable 1 View  Result Date: 08/15/2019 CLINICAL DATA:  Shortness of breath, fever EXAM: PORTABLE CHEST 1 VIEW COMPARISON:  06/27/2019 FINDINGS: Patchy bilateral airspace opacities within the lungs. Low lung volumes. Heart is normal size. No effusions or acute bony abnormality. IMPRESSION: Patchy bilateral airspace opacities concerning for pneumonia. Electronically Signed   By: Rolm Baptise M.D.   On: 08/15/2019 02:20    Pending Labs Unresulted Labs (From admission, onward)    Start     Ordered   08/16/19 0500  CBC with Differential/Platelet  Daily,   R     08/15/19 0843   08/16/19 0500  Comprehensive metabolic panel  Daily,   R     08/15/19 0843   08/16/19 0500  C-reactive protein  Daily,   R     08/15/19 0843   08/16/19 0500  D-dimer, quantitative (not at [facility])  Daily,   R     08/15/19 0843   08/16/19 0500  Ferritin  Daily,   R     08/15/19 0843   08/16/19 0500  Magnesium  Daily,   R     08/15/19 0843   08/16/19 0500  Phosphorus  Daily,   R     08/15/19 0843   08/15/19 2205  Respiratory Panel by PCR  (Respiratory virus panel with precautions)  Once,   STAT     08/15/19 2204   08/15/19 0843  Influenza panel by PCR (type A & B)  Add-on,   AD     08/15/19 0843   08/15/19 0522  Urine culture  ONCE - STAT,   STAT     08/15/19 0523   08/15/19 0229  Culture, blood (routine x 2)  BLOOD CULTURE X 2,   STAT     08/15/19 0228          Vitals/Pain Today's Vitals   08/15/19 1818 08/15/19 1900 08/15/19 2121 08/15/19 2314  BP: 120/63 110/68 115/66 111/72  Pulse: 85 71 77 74  Resp: (!) 29 (!) 29 (!) 21 (!) 29  Temp: 98 F  (36.7 C)  97.8 F (36.6 C)   TempSrc: Oral  Oral   SpO2: 97% 95% 94% 94%  Weight:      Height:      PainSc:   0-No pain     Isolation Precautions Airborne and Contact precautions  Medications Medications  atorvastatin (LIPITOR) tablet 40 mg (40 mg Oral Given 08/15/19 1818)  pantoprazole (PROTONIX) EC tablet 40 mg (40 mg Oral Given 08/15/19 1124)  ticagrelor (BRILINTA) tablet 90 mg (90 mg Oral Given 08/15/19 2119)  enoxaparin (LOVENOX) injection 40 mg (40 mg Subcutaneous Refused 08/15/19 1020)  sodium chloride flush (NS) 0.9 % injection 3 mL (3 mLs Intravenous Given 08/15/19 2119)  sodium chloride flush (NS) 0.9 % injection 3 mL (has no administration in time range)  0.9 %  sodium chloride infusion (has no  administration in time range)  acetaminophen (TYLENOL) tablet 650 mg (has no administration in time range)  oxyCODONE (Oxy IR/ROXICODONE) immediate release tablet 5 mg (has no administration in time range)  docusate sodium (COLACE) capsule 100 mg (100 mg Oral Refused 08/15/19 2119)  polyethylene glycol (MIRALAX / GLYCOLAX) packet 17 g (has no administration in time range)  bisacodyl (DULCOLAX) EC tablet 5 mg (has no administration in time range)  ondansetron (ZOFRAN) tablet 4 mg (has no administration in time range)    Or  ondansetron (ZOFRAN) injection 4 mg (has no administration in time range)  albuterol (VENTOLIN HFA) 108 (90 Base) MCG/ACT inhaler 2 puff (2 puffs Inhalation Given 08/15/19 2310)  methylPREDNISolone sodium succinate (SOLU-MEDROL) 125 mg/2 mL injection 61.25 mg (61.25 mg Intravenous Given 08/15/19 2117)  cefTRIAXone (ROCEPHIN) 2 g in sodium chloride 0.9 % 100 mL IVPB (has no administration in time range)  azithromycin (ZITHROMAX) 500 mg in sodium chloride 0.9 % 250 mL IVPB (has no administration in time range)  aspirin EC tablet 81 mg (has no administration in time range)  sodium chloride flush (NS) 0.9 % injection 3 mL (3 mLs Intravenous Given 08/15/19 0737)   cefTRIAXone (ROCEPHIN) 2 g in sodium chloride 0.9 % 100 mL IVPB (0 g Intravenous Stopped 08/15/19 0351)  azithromycin (ZITHROMAX) 500 mg in sodium chloride 0.9 % 250 mL IVPB (0 mg Intravenous Stopped 08/15/19 0539)  sodium chloride 0.9 % bolus 1,000 mL (0 mLs Intravenous Stopped 08/15/19 0737)    And  sodium chloride 0.9 % bolus 1,000 mL (0 mLs Intravenous Stopped 08/15/19 0847)    And  sodium chloride 0.9 % bolus 1,000 mL (0 mLs Intravenous Stopped 08/15/19 0949)    And  sodium chloride 0.9 % bolus 1,000 mL (0 mLs Intravenous Stopped 08/15/19 1124)  acetaminophen (TYLENOL) tablet 1,000 mg (1,000 mg Oral Given 08/15/19 0748)    Mobility walks Low fall risk   Focused Assessments    R Recommendations: See Admitting Provider Note  Report given to:   Additional Notes:

## 2019-08-15 NOTE — ED Provider Notes (Addendum)
[facility] EMERGENCY DEPARTMENT Provider Note   CSN: [id number] Arrival date & time: 08/15/19  0112    History   Chief Complaint Chief Complaint  Patient presents with  . Shortness of Breath  . Fever    HPI Randall Mullen is a 48 y.o. male.   The history is provided by the patient.  Shortness of Breath Associated symptoms: fever   Fever He has history of hyperlipidemia, coronary artery disease and comes in because of shortness of breath and fever.  On November 13, 2 family members tested positive for coronavirus, but he tested negative at that time.  6 days ago, he started developing shortness of breath which has gotten worse over the last 2 days.  He started running fever 4 days ago and has been over 101 each day.  There have been associated chills and sweats.  He has had cough which is nonproductive.  There has been some diarrhea.  He states that his sense of taste has changed and that things taste metallic, but there has not been loss of smell or loss of taste.  He denies arthralgias or myalgias.  He has been taking acetaminophen for fever control.  Of note, he had three coronary stents placed on October 15.  Past Medical History:  Diagnosis Date  . GERD (gastroesophageal reflux disease)   . Gout   . HLD (hyperlipidemia)     Patient Active Problem List   Diagnosis Date Noted  . Coronary artery disease involving native coronary artery of native heart with unstable angina pectoris (HCC)   . Chest pain 06/28/2019  . HLD (hyperlipidemia)   . Gout   . GERD (gastroesophageal reflux disease)     Past Surgical History:  Procedure Laterality Date  . CORONARY STENT INTERVENTION N/A 06/30/2019   Procedure: CORONARY STENT INTERVENTION;  Surgeon: Kathleene Hazel, MD;  Location: [facility] INVASIVE CV LAB;  Service: Cardiovascular;  Laterality: N/A;  . LEFT HEART CATH AND CORONARY ANGIOGRAPHY N/A 06/29/2019   Procedure: LEFT HEART CATH AND CORONARY  ANGIOGRAPHY;  Surgeon: Kathleene Hazel, MD;  Location: [facility] INVASIVE CV LAB;  Service: Cardiovascular;  Laterality: N/A;  . VENTRAL HERNIA REPAIR          Home Medications    Prior to Admission medications   Medication Sig Start Date End Date Taking? Authorizing Provider  atorvastatin (LIPITOR) 40 MG tablet Take 40 mg by mouth daily at 6 PM. 06/18/19   [provider]  nitroGLYCERIN (NITROSTAT) 0.4 MG SL tablet Place 1 tablet (0.4 mg total) under the tongue every 5 (five) minutes as needed for up to 15 days for chest pain. 07/01/19 07/16/19  Marzetta Board, MD  omeprazole (PRILOSEC) 40 MG capsule Take 40 mg by mouth at bedtime. 05/23/19   [provider]  TADALAFIL PO Take 9-18 mg by mouth as needed for erectile dysfunction. (chewable tablets) 05/20/19   [provider]  ticagrelor (BRILINTA) 90 MG TABS tablet Take 1 tablet (90 mg total) by mouth 2 (two) times daily. 08/01/19   Jodelle Red, MD  Vitamin D, Ergocalciferol, (DRISDOL) 1.25 MG (50000 UT) CAPS capsule Take 50,000 Units by mouth every Sunday.  05/15/19   [provider]    Family History Family History  Problem Relation Age of Onset  . CAD Father   . Heart attack Paternal Grandmother     Social History Social History   Tobacco Use  . Smoking status: Never Smoker  . Smokeless tobacco: Never Used  Substance Use Topics  . Alcohol use: Yes  . Drug use: Never     Allergies   Amoxicillin   Review of Systems Review of Systems  Constitutional: Positive for fever.  Respiratory: Positive for shortness of breath.   All other systems reviewed and are negative.    Physical Exam Updated Vital Signs Pulse (!) (P) 130   Temp (!) (P) 102.9 F (39.4 C) (Oral)   Resp (!) (P) 24   Ht 6\' 3"  (1.905 m)   Wt 127 kg   SpO2 (P) 94%   BMI 35.00 kg/m   Physical Exam Vitals signs and nursing note reviewed.    48 year old male, appears dyspneic at rest, but is in  no acute distress. Vital signs are significant for elevated heart rate, respiratory rate, temperature. Oxygen saturation is 94%, which is normal. Head is normocephalic and atraumatic. PERRLA, EOMI. Oropharynx is clear. Neck is nontender and supple without adenopathy or JVD. Back is nontender and there is no CVA tenderness. Lungs are clear without rales, wheezes, or rhonchi. Chest is nontender. Heart has regular rate and rhythm without murmur. Abdomen is soft, flat, nontender without masses or hepatosplenomegaly and peristalsis is normoactive. Extremities have no cyanosis or edema, full range of motion is present. Skin is warm and dry without rash. Neurologic: Mental status is normal, cranial nerves are intact, there are no motor or sensory deficits.  ED Treatments / Results  Labs (all labs ordered are listed, but only abnormal results are displayed) Labs Reviewed  LACTIC ACID, PLASMA - Abnormal; Notable for the following components:      Result Value   Lactic Acid, Venous 2.1 (*)    All other components within normal limits  COMPREHENSIVE METABOLIC PANEL - Abnormal; Notable for the following components:   Glucose, Bld 128 (*)    Calcium 8.7 (*)    Albumin 3.3 (*)    Alkaline Phosphatase 30 (*)    All other components within normal limits  URINALYSIS, ROUTINE W REFLEX MICROSCOPIC - Abnormal; Notable for the following components:   Protein, ur 30 (*)    All other components within normal limits  CULTURE, BLOOD (ROUTINE X 2)  CULTURE, BLOOD (ROUTINE X 2)  SARS CORONAVIRUS 2 (TAT 6-24 HRS)  URINE CULTURE  CBC WITH DIFFERENTIAL/PLATELET  LACTIC ACID, PLASMA  APTT  PROTIME-INR  POC SARS CORONAVIRUS 2 AG -  ED    EKG EKG Interpretation  Date/Time:  Monday August 15 2019 05:33:21 EST Ventricular Rate:  105 PR Interval:    QRS Duration: 75 QT Interval:  339 QTC Calculation: 448 R Axis:   18 Text Interpretation: Sinus tachycardia Borderline repolarization abnormality When  compared with ECG of 07/01/2019, Nonspecific T wave abnormality has improved Confirmed by Dione BoozeGlick, Dyane Broberg ([id number]) on 08/15/2019 5:51:47 AM   Radiology Dg Chest Portable 1 View  Result Date: 08/15/2019 CLINICAL DATA:  Shortness of breath, fever EXAM: PORTABLE CHEST 1 VIEW COMPARISON:  06/27/2019 FINDINGS: Patchy bilateral airspace opacities within the lungs. Low lung volumes. Heart is normal size. No effusions or acute bony abnormality. IMPRESSION: Patchy bilateral airspace opacities concerning for pneumonia. Electronically Signed   By: Charlett NoseKevin  Dover M.D.   On: 08/15/2019 02:20    Procedures Procedures    Medications Ordered in ED Medications  sodium chloride flush (NS) 0.9 % injection 3 mL (has no administration in time range)     Initial Impression / Assessment and Plan / ED Course  I have reviewed the triage vital signs and  the nursing notes.  Pertinent labs & imaging results that were available during my care of the patient were reviewed by me and considered in my medical decision making (see chart for details).  Symptom complex concerning for COVID-19 infection.  Chest x-ray shows bilateral infiltrates consistent with coronavirus infection.  However, with fever and x-ray findings of pneumonia, will also treat for possible community-acquired pneumonia.  Repeat testing for coronavirus is initiated.  Old records are reviewed confirming recent placement of 3 coronary stents.  Covid antigen test is negative.  Urinalysis is normal.  WBC is normal but with a left shift.  Metabolic panel is unremarkable.  ECG shows slight improvement in prior nonspecific T wave changes.  Lactic acid level is mildly elevated, and he is started on early goal-directed fluids.  Specimen has been sent for coronavirus PCR testing.  Case is discussed with Dr. Alcario Drought of [facility], who agrees to admit the patient.  Randall Mullen was evaluated in Emergency Department on 08/15/2019 for the symptoms described  in the history of present illness. He was evaluated in the context of the global COVID-19 pandemic, which necessitated consideration that the patient might be at risk for infection with the SARS-CoV-2 virus that causes COVID-19. Institutional protocols and algorithms that pertain to the evaluation of patients at risk for COVID-19 are in a state of rapid change based on information released by regulatory bodies including the CDC and federal and state organizations. These policies and algorithms were followed during the patient's care in the ED.   Final Clinical Impressions(s) / ED Diagnoses   Final diagnoses:  Community acquired pneumonia, unspecified laterality  Suspected COVID-19 virus infection    ED Discharge Orders    None       Delora Fuel, MD 22/97/98 9211    Delora Fuel, MD 94/17/40 [phone]

## 2019-08-15 NOTE — ED Triage Notes (Signed)
Pt to ED with c/o shortness of breath and fever.  Pt st's his wife and sons tested positive for covid on11/13  Pt st's he then began to feel bad and was tested on 11/17 pt st's his test came back neg. But he continued to feel bad.  Pt st's fever has been up and down, now c/o shortness of breath.  Pt st's he took Tylenol 1500mg  at 01:00 and temp remains at 102.9

## 2019-08-16 ENCOUNTER — Encounter (HOSPITAL_COMMUNITY): Payer: Self-pay

## 2019-08-16 LAB — COMPREHENSIVE METABOLIC PANEL
ALT: 26 U/L (ref 0–44)
AST: 22 U/L (ref 15–41)
Albumin: 2.9 g/dL — ABNORMAL LOW (ref 3.5–5.0)
Alkaline Phosphatase: 28 U/L — ABNORMAL LOW (ref 38–126)
Anion gap: 13 (ref 5–15)
BUN: 11 mg/dL (ref 6–20)
CO2: 25 mmol/L (ref 22–32)
Calcium: 8.5 mg/dL — ABNORMAL LOW (ref 8.9–10.3)
Chloride: 104 mmol/L (ref 98–111)
Creatinine, Ser: 1.06 mg/dL (ref 0.61–1.24)
GFR calc Af Amer: >60 mL/min (ref >60)
GFR calc non Af Amer: >60 mL/min (ref >60)
Glucose, Bld: 153 mg/dL — ABNORMAL HIGH (ref 70–99)
Potassium: 4.3 mmol/L (ref 3.5–5.1)
Sodium: 142 mmol/L (ref 135–145)
Total Bilirubin: 0.5 mg/dL (ref 0.3–1.2)
Total Protein: 6.6 g/dL (ref 6.5–8.1)

## 2019-08-16 LAB — RESPIRATORY PANEL BY PCR

## 2019-08-16 LAB — FERRITIN: Ferritin: 435 ng/mL — ABNORMAL HIGH (ref 24–336)

## 2019-08-16 LAB — CBC WITH DIFFERENTIAL/PLATELET
Abs Immature Granulocytes: 0.03 10*3/uL (ref 0.00–0.07)
Basophils Absolute: 0 10*3/uL (ref 0.0–0.1)
Basophils Relative: 0 %
Eosinophils Absolute: 0 10*3/uL (ref 0.0–0.5)
Eosinophils Relative: 0 %
HCT: 41.1 % (ref 39.0–52.0)
Hemoglobin: 13.7 g/dL (ref 13.0–17.0)
Immature Granulocytes: 1 %
Lymphocytes Relative: 12 %
Lymphs Abs: 0.7 10*3/uL (ref 0.7–4.0)
MCH: 29.1 pg (ref 26.0–34.0)
MCHC: 33.3 g/dL (ref 30.0–36.0)
MCV: 87.4 fL (ref 80.0–100.0)
Monocytes Absolute: 0.1 10*3/uL (ref 0.1–1.0)
Monocytes Relative: 2 %
Neutro Abs: 5 10*3/uL (ref 1.7–7.7)
Neutrophils Relative %: 85 %
Platelets: 286 10*3/uL (ref 150–400)
RBC: 4.7 MIL/uL (ref 4.22–5.81)
RDW: 12.8 % (ref 11.5–15.5)
WBC: 5.8 10*3/uL (ref 4.0–10.5)
nRBC: 0 % (ref 0.0–0.2)

## 2019-08-16 LAB — PHOSPHORUS: Phosphorus: 2.7 mg/dL (ref 2.5–4.6)

## 2019-08-16 LAB — D-DIMER, QUANTITATIVE: D-Dimer, Quant: 1.21 ug/mL-FEU — ABNORMAL HIGH (ref 0.00–0.50)

## 2019-08-16 LAB — URINE CULTURE: Culture: NO GROWTH

## 2019-08-16 LAB — MAGNESIUM: Magnesium: 2.3 mg/dL (ref 1.7–2.4)

## 2019-08-16 LAB — C-REACTIVE PROTEIN: CRP: 3.9 mg/dL — ABNORMAL HIGH (ref <1.0)

## 2019-08-16 LAB — SARS CORONAVIRUS 2 (TAT 6-24 HRS): SARS Coronavirus 2: NEGATIVE

## 2019-08-16 MED ORDER — FUROSEMIDE 10 MG/ML IJ SOLN
40.0000 mg | Freq: Once | INTRAMUSCULAR | Status: AC
  Administered 2019-08-16: 40 mg via INTRAVENOUS
  Filled 2019-08-16: qty 4

## 2019-08-16 MED ORDER — ENSURE ENLIVE PO LIQD
237.0000 mL | Freq: Two times a day (BID) | ORAL | Status: DC
  Administered 2019-08-16: 237 mL via ORAL

## 2019-08-16 NOTE — Plan of Care (Signed)
  Problem: Respiratory: Goal: Will maintain a patent airway Outcome: Progressing   Problem: Respiratory: Goal: Complications related to the disease process, condition or treatment will be avoided or minimized Outcome: Progressing   

## 2019-08-16 NOTE — Progress Notes (Signed)
[facility]  PROGRESS NOTE  Randall Mullen LXB:[id number] DOB: 10/07/1970 DOA: 08/15/2019 PCP: Judge Stall, MD   Brief HPI:   48 year old male with a history of hyperlipidemia, gout, CAD status post stent placement in October 2020, GERD presents with shortness of breath.  As per patient his wife and son tested positive for Covid on 07/29/2019 and started developing symptoms.  He was tested on 08/02/2019 and test was negative.  He continued to have symptoms at home with fever cough and shortness of breath.  Yesterday patient had a panic attack and came to ED for further evaluation.  In the ED chest x-ray showed bilateral infiltrates, patient was not requiring oxygen O2 sats was 92% on room air.  He had a fever of 102 F.  Patient was started on Rocephin and Zithromax in the ED.    POC Covid 19 x 2 and SARS-CoV-2 PCR x 1, all 3 tests were negative.    Subjective   Patient seen and examined, he is feeling better today.  Denies chest pain or shortness of breath.  Still not requiring oxygen.  Covid 19 tests x3 were negative since yesterday.   Assessment/Plan:     1. Bilateral pneumonia-patient presented with fever 102 F, chest x-ray consistent with bilateral infiltrates, though procalcitonin was less than 0.10, Covid 19 tests x3 are negative.  Patient was started on Solu-Medrol, ceftriaxone and Zithromax.  Patient symptoms have improved.  He never required oxygen.  O2 sats ranging from 92 to 96% on room air.  CRP 3.9, ferritin 435, D-dimer 1.21, WBC 5.8.  I called and discussed with infectious disease physician on-call Dr. Lorenso Courier, who recommended repeat SARS-CoV-2 PCR, and if negative then D/C airborne precautions.  I will repeat SARS-CoV-2 PCR, continue current management.  Patient is not tachycardic or hypoxic so I doubt that he has pulmonary embolism.  Follow D-dimer, CRP in a.m.  2. Coronary artery disease-s/p stent placement, continue aspirin, Brilinta,  Lipitor.     CBC: Recent Labs  Lab 08/15/19 0202 08/16/19 0052  WBC 8.0 5.8  NEUTROABS 6.6 5.0  HGB 14.9 13.7  HCT 44.7 41.1  MCV 88.0 87.4  PLT 262 286    Basic Metabolic Panel: Recent Labs  Lab 08/15/19 0202 08/16/19 0052  NA 139 142  K 3.6 4.3  CL 102 104  CO2 25 25  GLUCOSE 128* 153*  BUN 12 11  CREATININE 1.20 1.06  CALCIUM 8.7* 8.5*  MG  --  2.3  PHOS  --  2.7    DVT prophylaxis: Lovenox  Code Status: Full code  Family Communication: No family at bedside  Disposition Plan: likely home when medically ready for discharge     BMI  Estimated body mass index is 33.75 kg/m as calculated from the following:   Height as of this encounter: 6\' 3"  (1.905 m).   Weight as of this encounter: 122.5 kg.  Scheduled medications:  . aspirin EC  81 mg Oral Daily  . atorvastatin  40 mg Oral q1800  . docusate sodium  100 mg Oral BID  . enoxaparin (LOVENOX) injection  40 mg Subcutaneous Daily  . feeding supplement (ENSURE ENLIVE)  237 mL Oral BID BM  . methylPREDNISolone (SOLU-MEDROL) injection  0.5 mg/kg Intravenous Q12H  . pantoprazole  40 mg Oral Daily  . sodium chloride flush  3 mL Intravenous Q12H  . ticagrelor  90 mg Oral BID    Consultants:    Procedures:     Antibiotics:   Anti-infectives (From  admission, onward)   Start     Dose/Rate Route Frequency Ordered Stop   08/16/19 0500  azithromycin (ZITHROMAX) 500 mg in sodium chloride 0.9 % 250 mL IVPB     500 mg 250 mL/hr over 60 Minutes Intravenous  Once 08/15/19 1742 08/16/19 0631   08/16/19 0400  cefTRIAXone (ROCEPHIN) 2 g in sodium chloride 0.9 % 100 mL IVPB     2 g 200 mL/hr over 30 Minutes Intravenous Every 24 hours 08/15/19 1742     08/15/19 0245  cefTRIAXone (ROCEPHIN) 2 g in sodium chloride 0.9 % 100 mL IVPB     2 g 200 mL/hr over 30 Minutes Intravenous  Once 08/15/19 0234 08/15/19 0351   08/15/19 0245  azithromycin (ZITHROMAX) 500 mg in sodium chloride 0.9 % 250 mL IVPB     500  mg 250 mL/hr over 60 Minutes Intravenous  Once 08/15/19 0234 08/15/19 0539       Objective   Vitals:   08/15/19 2314 08/16/19 0000 08/16/19 0400 08/16/19 0800  BP: 111/72 125/83 117/67 129/84  Pulse: 74 80 73 70  Resp: (!) 29   (!) 25  Temp:  98 F (36.7 C) 97.7 F (36.5 C) 97.7 F (36.5 C)  TempSrc:  Oral Oral Oral  SpO2: 94% 90%  92%  Weight:      Height:        Intake/Output Summary (Last 24 hours) at 08/16/2019 1300 Last data filed at 08/16/2019 0500 Gross per 24 hour  Intake -  Output 3500 ml  Net -3500 ml   Filed Weights   08/15/19 0209 08/15/19 0534  Weight: 127 kg 122.5 kg     Physical Examination:    General: Appears in no acute distress  Cardiovascular: S1-S2, regular no murmur auscultated  Respiratory: Clear to auscultation bilaterally  Abdomen: Abdomen is soft, nontender, no organomegaly  Extremities: No edema in the lower extremities  Neurologic: Alert , Oriented x3, no focal deficit noted     Data Reviewed: I have personally reviewed following labs and imaging studies   Recent Results (from the past 240 hour(s))  Culture, blood (routine x 2)     Status: None (Preliminary result)   Collection Time: 08/15/19  2:49 AM   Specimen: BLOOD  Result Value Ref Range Status   Specimen Description BLOOD RIGHT ANTECUBITAL  Final   Special Requests   Final    BOTTLES DRAWN AEROBIC AND ANAEROBIC Blood Culture adequate volume   Culture   Final    NO GROWTH 1 DAY Performed at [facility], [address], [place], [place] [address]    Report Status PENDING  Incomplete  Culture, blood (routine x 2)     Status: None (Preliminary result)   Collection Time: 08/15/19  2:49 AM   Specimen: BLOOD RIGHT FOREARM  Result Value Ref Range Status   Specimen Description BLOOD RIGHT FOREARM  Final   Special Requests   Final    BOTTLES DRAWN AEROBIC AND ANAEROBIC Blood Culture adequate volume   Culture   Final    NO GROWTH 1 DAY Performed at [facility], [address], [place], [place] [address]    Report Status PENDING  Incomplete  SARS CORONAVIRUS 2 (TAT 6-24 HRS) Nasopharyngeal Nasopharyngeal Swab     Status: None   Collection Time: 08/15/19  3:45 AM   Specimen: Nasopharyngeal Swab  Result Value Ref Range Status   SARS Coronavirus 2 NEGATIVE NEGATIVE Final    Comment: (NOTE)  SARS-CoV-2 target nucleic acids are NOT DETECTED. The SARS-CoV-2 RNA is generally detectable in upper and lower respiratory specimens during the acute phase of infection. Negative results do not preclude SARS-CoV-2 infection, do not rule out co-infections with other pathogens, and should not be used as the sole basis for treatment or other patient management decisions. Negative results must be combined with clinical observations, patient history, and epidemiological information. The expected result is Negative. Fact Sheet for Patients: HairSlick.nohttps://www.fda.gov/media/138098/download Fact Sheet for Healthcare Providers: quierodirigir.comhttps://www.fda.gov/media/138095/download This test is not yet approved or cleared by the [place] FDA and  has been authorized for detection and/or diagnosis of SARS-CoV-2 by FDA under an Emergency Use Authorization (EUA). This EUA will remain  in effect (meaning this test can be used) for the duration of the COVID-19 declaration under Section 56 4(b)(1) of the Act, 21 U.S.C. section 360bbb-3(b)(1), unless the authorization is terminated or revoked sooner. Performed at [facility], [address] [place], [place] [address]   Urine culture     Status: None   Collection Time: 08/15/19  5:39 AM   Specimen: In/Out Cath Urine  Result Value Ref Range Status   Specimen Description IN/OUT CATH URINE  Final   Special Requests NONE  Final   Culture   Final    NO GROWTH Performed at [facility], [address] [place], [place] [address]    Report Status 08/16/2019 FINAL  Final  Respiratory Panel by PCR     Status: None    Collection Time: 08/15/19 11:15 PM   Specimen: Nasopharyngeal Swab; Respiratory  Result Value Ref Range Status   Adenovirus NOT DETECTED NOT DETECTED Final   Coronavirus 229E NOT DETECTED NOT DETECTED Final    Comment: (NOTE) The Coronavirus on the Respiratory Panel, DOES NOT test for the novel  Coronavirus (2019 nCoV)    Coronavirus HKU1 NOT DETECTED NOT DETECTED Final   Coronavirus NL63 NOT DETECTED NOT DETECTED Final   Coronavirus OC43 NOT DETECTED NOT DETECTED Final   Metapneumovirus NOT DETECTED NOT DETECTED Final   Rhinovirus / Enterovirus NOT DETECTED NOT DETECTED Final   Influenza A NOT DETECTED NOT DETECTED Final   Influenza B NOT DETECTED NOT DETECTED Final   Parainfluenza Virus 1 NOT DETECTED NOT DETECTED Final   Parainfluenza Virus 2 NOT DETECTED NOT DETECTED Final   Parainfluenza Virus 3 NOT DETECTED NOT DETECTED Final   Parainfluenza Virus 4 NOT DETECTED NOT DETECTED Final   Respiratory Syncytial Virus NOT DETECTED NOT DETECTED Final   Bordetella pertussis NOT DETECTED NOT DETECTED Final   Chlamydophila pneumoniae NOT DETECTED NOT DETECTED Final   Mycoplasma pneumoniae NOT DETECTED NOT DETECTED Final    Comment: Performed at [facility], [address] [place], [place] [address]     Liver Function Tests: Recent Labs  Lab 08/15/19 0202 08/16/19 0052  AST 30 22  ALT 32 26  ALKPHOS 30* 28*  BILITOT 0.6 0.5  PROT 7.1 6.6  ALBUMIN 3.3* 2.9*   No results for input(s): LIPASE, AMYLASE in the last 168 hours. No results for input(s): AMMONIA in the last 168 hours.  Cardiac Enzymes: No results for input(s): CKTOTAL, CKMB, CKMBINDEX, TROPONINI in the last 168 hours. BNP (last 3 results) Recent Labs    06/28/19 2213 08/15/19 0843  BNP 53.2 27.5    ProBNP (last 3 results) No results for input(s): PROBNP in the last 8760 hours.    Studies: Dg Chest Portable 1 View  Result Date: 08/15/2019  CLINICAL DATA:  Shortness of breath, fever EXAM: PORTABLE  CHEST 1 VIEW COMPARISON:  06/27/2019 FINDINGS: Patchy bilateral airspace opacities within the lungs. Low lung volumes. Heart is normal size. No effusions or acute bony abnormality. IMPRESSION: Patchy bilateral airspace opacities concerning for pneumonia. Electronically Signed   By: Rolm Baptise M.D.   On: 08/15/2019 02:20     Admission status: Inpatient: Based on patients clinical presentation and evaluation of above clinical data, I have made determination that patient meets Inpatient criteria at this time.   [facility] Pager [phone]. If 7PM-7AM, please contact night-coverage at [email or web address], Office  [phone]  password [email or web address]  08/16/2019, 1:00 PM  LOS: 1 day

## 2019-08-17 ENCOUNTER — Inpatient Hospital Stay (HOSPITAL_COMMUNITY): Payer: BC Managed Care – PPO

## 2019-08-17 DIAGNOSIS — R9431 Abnormal electrocardiogram [ECG] [EKG]: Secondary | ICD-10-CM

## 2019-08-17 LAB — COMPREHENSIVE METABOLIC PANEL
ALT: 29 U/L (ref 0–44)
AST: 21 U/L (ref 15–41)
Albumin: 2.9 g/dL — ABNORMAL LOW (ref 3.5–5.0)
Alkaline Phosphatase: 27 U/L — ABNORMAL LOW (ref 38–126)
Anion gap: 14 (ref 5–15)
BUN: 19 mg/dL (ref 6–20)
CO2: 25 mmol/L (ref 22–32)
Calcium: 8.7 mg/dL — ABNORMAL LOW (ref 8.9–10.3)
Chloride: 100 mmol/L (ref 98–111)
Creatinine, Ser: 1.09 mg/dL (ref 0.61–1.24)
GFR calc Af Amer: >60 mL/min (ref >60)
GFR calc non Af Amer: >60 mL/min (ref >60)
Glucose, Bld: 167 mg/dL — ABNORMAL HIGH (ref 70–99)
Potassium: 4.3 mmol/L (ref 3.5–5.1)
Sodium: 139 mmol/L (ref 135–145)
Total Bilirubin: 0.7 mg/dL (ref 0.3–1.2)
Total Protein: 6.1 g/dL — ABNORMAL LOW (ref 6.5–8.1)

## 2019-08-17 LAB — CBC WITH DIFFERENTIAL/PLATELET
Abs Immature Granulocytes: 0.05 10*3/uL (ref 0.00–0.07)
Basophils Absolute: 0 10*3/uL (ref 0.0–0.1)
Basophils Relative: 0 %
Eosinophils Absolute: 0 10*3/uL (ref 0.0–0.5)
Eosinophils Relative: 0 %
HCT: 42.3 % (ref 39.0–52.0)
Hemoglobin: 13.9 g/dL (ref 13.0–17.0)
Immature Granulocytes: 1 %
Lymphocytes Relative: 10 %
Lymphs Abs: 1 10*3/uL (ref 0.7–4.0)
MCH: 28.8 pg (ref 26.0–34.0)
MCHC: 32.9 g/dL (ref 30.0–36.0)
MCV: 87.8 fL (ref 80.0–100.0)
Monocytes Absolute: 0.3 10*3/uL (ref 0.1–1.0)
Monocytes Relative: 3 %
Neutro Abs: 8.6 10*3/uL — ABNORMAL HIGH (ref 1.7–7.7)
Neutrophils Relative %: 86 %
Platelets: 367 10*3/uL (ref 150–400)
RBC: 4.82 MIL/uL (ref 4.22–5.81)
RDW: 12.9 % (ref 11.5–15.5)
WBC: 10 10*3/uL (ref 4.0–10.5)
nRBC: 0 % (ref 0.0–0.2)

## 2019-08-17 LAB — FERRITIN: Ferritin: 435 ng/mL — ABNORMAL HIGH (ref 24–336)

## 2019-08-17 LAB — C-REACTIVE PROTEIN: CRP: 1.3 mg/dL — ABNORMAL HIGH (ref <1.0)

## 2019-08-17 LAB — ECHOCARDIOGRAM LIMITED
Height: 75 in
Weight: 4320 oz

## 2019-08-17 LAB — PHOSPHORUS: Phosphorus: 4.2 mg/dL (ref 2.5–4.6)

## 2019-08-17 LAB — D-DIMER, QUANTITATIVE: D-Dimer, Quant: 0.97 ug/mL-FEU — ABNORMAL HIGH (ref 0.00–0.50)

## 2019-08-17 LAB — MAGNESIUM: Magnesium: 2.6 mg/dL — ABNORMAL HIGH (ref 1.7–2.4)

## 2019-08-17 MED ORDER — AMOXICILLIN-POT CLAVULANATE 875-125 MG PO TABS: 1.0000 | ORAL_TABLET | Freq: Two times a day (BID) | ORAL | 0 refills | Status: AC

## 2019-08-17 MED ORDER — DEXAMETHASONE 6 MG PO TABS: 6.0000 mg | ORAL_TABLET | Freq: Every day | ORAL | 0 refills | Status: AC

## 2019-08-17 NOTE — Progress Notes (Signed)
Initial Nutrition Assessment  DOCUMENTATION CODES:   Obesity unspecified  INTERVENTION:   -Ensure Enlive po BID, each supplement provides 350 kcal and 20 grams of protein  NUTRITION DIAGNOSIS:   Increased nutrient needs related to acute illness as evidenced by estimated needs.  GOAL:   Patient will meet greater than or equal to 90% of their needs  MONITOR:   PO intake, Supplement acceptance, Labs, Weight trends, I & O's  REASON FOR ASSESSMENT:   Malnutrition Screening Tool    ASSESSMENT:   48 year old male with a history of hyperlipidemia, gout, CAD status post stent placement in October 2020, GERD presents with shortness of breath.  As per patient his wife and son tested positive for Covid on 07/29/2019 and started developing symptoms.  He was tested on 08/02/2019 and test was negative.  He continued to have symptoms at home with fever cough and shortness of breath.  **RD working remotely**  Patient admitted with bilateral pneumonia, COVID-19 tests have been negative.  Pt reports having some diarrhea and having changes in taste.  No PO has been documented at this time.  Ensure supplements have been ordered, pt has accepted 1 in the last 24 hours.   Per weight records, pt has lost 28 lbs since 10/16 (9% wt loss x 1.5 months, significant for time frame).   I/Os: -1.4L since admit UOP: 500 ml x 24 hrs  Medications reviewed. Labs reviewed: Elevated Mg  NUTRITION - FOCUSED PHYSICAL EXAM:  Working remotely.  Diet Order:   Diet Order            Diet Heart Room service appropriate? No; Fluid consistency: Thin  Diet effective now              EDUCATION NEEDS:   No education needs have been identified at this time  Skin:  Skin Assessment: Reviewed RN Assessment  Last BM:  12/2  Height:   Ht Readings from Last 1 Encounters:  08/15/19 6\' 3"  (1.905 m)    Weight:   Wt Readings from Last 1 Encounters:  08/15/19 122.5 kg    Ideal Body Weight:  89.1  kg  BMI:  Body mass index is 33.75 kg/m.  Estimated Nutritional Needs:   Kcal:  2200-2400  Protein:  100-110g  Fluid:  2L/day  Clayton Bibles, MS, RD, LDN Inpatient Clinical Dietitian Pager: [phone] After Hours Pager: [phone]

## 2019-08-17 NOTE — Progress Notes (Signed)
  Echocardiogram 2D Echocardiogram has been performed.  Jennette Dubin 08/17/2019, 11:56 AM

## 2019-08-17 NOTE — Progress Notes (Signed)
Pt HR= 48. Pt A&OX4. Pt is asymptomatic. Hospitalist was made aware. Cont to monitor.

## 2019-08-17 NOTE — Discharge Summary (Signed)
Physician Discharge Summary  Immanuel Fedak WUJ:[id number] DOB: [birth year] DOA: 08/15/2019  PCP: Judge Stall, MD  Admit date: 08/15/2019 Discharge date: 08/17/2019  Admitted From: Home Disposition: Home  Recommendations for Outpatient Follow-up:  1. Follow up with PCP in 1-2 weeks   Home Health: Not applicable Equipment/Devices: Not applicable  Discharge Condition: Stable CODE STATUS: Full code Diet recommendation: Heart healthy diet  Discharge summary: 48 year old gentleman with history of hyperlipidemia, gout, coronary artery disease status post stent placement in October 2020, GERD who presented to the emergency room on 08/15/2019 with shortness of breath.  As per patient, his wife and son tested positive for Covid on 07/29/2019 and he started developing symptoms.  They have improved.  Patient was tested for COVID 19 on 08/02/2019, negative Patient had an episode of what looked like a panic attack so came to the ER.  In the emergency room chest x-ray showed bilateral infiltrates, patient was 92% on room air, he had a temperature of 102.  Patient was started on Rocephin and azithromycin in the emergency room and admitted to the hospital.  Given high incidence of COVID-19 as well as his 2 family members with positive test, bilateral infiltrate and borderline hypoxia patient was tested for COVID-19 repeatedly. 11/17, outpatient negative 11/30, PCR hospital negative 11/30, point-of-care testing negative X2 12/1, PCR negative Respiratory virus panel negative. Blood cultures negative.  Patient has no fever for last 48 hours.  He has no chest pain or shortness of breath at rest.  Telemetry normal.  A 2D echocardiogram was normal with normal ejection fraction no new findings.  No evidence of ischemia.  Currently walking in the room, drops 90-91% on ambulating. We will treat patient as community-acquired pneumonia.  Since he has adequate improvement and wants to go home, will  prescribe 7 days of Augmentin to take by mouth.  Amoxicillin allergy was dry mouth, taken of the allergy list. COVID-19 pneumonia is still remains on differential diagnosis, advised patient all isolation precautions, precautions at home and symptom monitoring.  He might have benefited with the steroids, will prescribe 7 more days of dexamethasone 6 mg daily.   Discharge Diagnoses:  Active Problems:   Multifocal pneumonia    Discharge Instructions  Discharge Instructions    Call MD for:  difficulty breathing, headache or visual disturbances   Complete by: As directed    Call MD for:  temperature >100.4   Complete by: As directed    Diet - low sodium heart healthy   Complete by: As directed    Discharge instructions   Complete by: As directed    Monitor your symptoms at home.  Check your oxygen. Can take over-the-counter cough medications.   Increase activity slowly   Complete by: As directed    MyChart COVID-19 home monitoring program   Complete by: Aug 17, 2019    Is the patient willing to use the MyChart Mobile App for home monitoring?: Yes   Temperature monitoring   Complete by: Aug 17, 2019    After how many days would you like to receive a notification of this patient's flowsheet entries?: 1     Allergies as of 08/17/2019   No Active Allergies     Medication List    TAKE these medications   amoxicillin-clavulanate 875-125 MG tablet Commonly known as: Augmentin Take 1 tablet by mouth every 12 (twelve) hours for 7 days.   aspirin EC 81 MG tablet Take 81 mg by mouth daily.   atorvastatin 40 MG tablet  Commonly known as: LIPITOR Take 40 mg by mouth daily at 6 PM.   dexamethasone 6 MG tablet Commonly known as: DECADRON Take 1 tablet (6 mg total) by mouth daily for 7 days.   indomethacin 25 MG capsule Commonly known as: INDOCIN Take 25 mg by mouth daily as needed. Gout flare-up   nitroGLYCERIN 0.4 MG SL tablet Commonly known as: NITROSTAT Place 1 tablet (0.4  mg total) under the tongue every 5 (five) minutes as needed for up to 15 days for chest pain.   omeprazole 40 MG capsule Commonly known as: PRILOSEC Take 40 mg by mouth daily as needed (reflux/heartburn).   TADALAFIL PO Take 9-18 mg by mouth as needed (ED). (chewable tablets)   ticagrelor 90 MG Tabs tablet Commonly known as: Brilinta Take 1 tablet (90 mg total) by mouth 2 (two) times daily.   Vitamin D (Ergocalciferol) 1.25 MG (50000 UT) Caps capsule Commonly known as: DRISDOL Take 50,000 Units by mouth every Sunday.       No Active Allergies  Consultations:  None.   Procedures/Studies: Dg Chest Portable 1 View  Result Date: 08/15/2019 CLINICAL DATA:  Shortness of breath, fever EXAM: PORTABLE CHEST 1 VIEW COMPARISON:  06/27/2019 FINDINGS: Patchy bilateral airspace opacities within the lungs. Low lung volumes. Heart is normal size. No effusions or acute bony abnormality. IMPRESSION: Patchy bilateral airspace opacities concerning for pneumonia. Electronically Signed   By: Charlett Nose M.D.   On: 08/15/2019 02:20    Subjective: Patient seen and examined.  No overnight events.  Denies any complaints at rest.  No cough or wheezing.  Still has some shortness of breath when he walks around.  Oxygen was measured 91% while ambulating. Eagerly wants to go home, he knows how to monitor his symptoms. Very knowledgeable about isolation precautions and how to watch for any worsening symptoms.   Discharge Exam: Vitals:   08/17/19 0403 08/17/19 0753  BP: 107/73 114/73  Pulse: (!) 54 81  Resp: 20   Temp: 97.6 F (36.4 C) 97.7 F (36.5 C)  SpO2: 93% 90%   Vitals:   08/16/19 1600 08/17/19 0045 08/17/19 0403 08/17/19 0753  BP: 138/81 120/75 107/73 114/73  Pulse: 66 65 (!) 54 81  Resp: 20 19 20    Temp: 97.7 F (36.5 C) 98 F (36.7 C) 97.6 F (36.4 C) 97.7 F (36.5 C)  TempSrc: Oral Oral Oral Oral  SpO2: 92% 93% 93% 90%  Weight:      Height:        General: Pt is alert,  awake, not in acute distress, on room air. Cardiovascular: RRR, S1/S2 +, no rubs, no gallops Respiratory: CTA bilaterally, no wheezing, no rhonchi Abdominal: Soft, NT, ND, bowel sounds + Extremities: no edema, no cyanosis    The results of significant diagnostics from this hospitalization (including imaging, microbiology, ancillary and laboratory) are listed below for reference.     Microbiology: Recent Results (from the past 240 hour(s))  Culture, blood (routine x 2)     Status: None (Preliminary result)   Collection Time: 08/15/19  2:49 AM   Specimen: BLOOD  Result Value Ref Range Status   Specimen Description BLOOD RIGHT ANTECUBITAL  Final   Special Requests   Final    BOTTLES DRAWN AEROBIC AND ANAEROBIC Blood Culture adequate volume   Culture   Final    NO GROWTH 2 DAYS Performed at [facility], [address] [place], [place]    Report Status PENDING  Incomplete  Culture, blood (routine x 2)     Status: None (Preliminary result)   Collection Time: 08/15/19  2:49 AM   Specimen: BLOOD RIGHT FOREARM  Result Value Ref Range Status   Specimen Description BLOOD RIGHT FOREARM  Final   Special Requests   Final    BOTTLES DRAWN AEROBIC AND ANAEROBIC Blood Culture adequate volume   Culture   Final    NO GROWTH 2 DAYS Performed at [facility], [address] [place], [place] [address]    Report Status PENDING  Incomplete  SARS CORONAVIRUS 2 (TAT 6-24 HRS) Nasopharyngeal Nasopharyngeal Swab     Status: None   Collection Time: 08/15/19  3:45 AM   Specimen: Nasopharyngeal Swab  Result Value Ref Range Status   SARS Coronavirus 2 NEGATIVE NEGATIVE Final    Comment: (NOTE) SARS-CoV-2 target nucleic acids are NOT DETECTED. The SARS-CoV-2 RNA is generally detectable in upper and lower respiratory specimens during the acute phase of infection. Negative results do not preclude SARS-CoV-2 infection, do not rule out co-infections with other pathogens, and should  not be used as the sole basis for treatment or other patient management decisions. Negative results must be combined with clinical observations, patient history, and epidemiological information. The expected result is Negative. Fact Sheet for Patients: HairSlick.nohttps://www.fda.gov/media/138098/download Fact Sheet for Healthcare Providers: quierodirigir.comhttps://www.fda.gov/media/138095/download This test is not yet approved or cleared by the [place] FDA and  has been authorized for detection and/or diagnosis of SARS-CoV-2 by FDA under an Emergency Use Authorization (EUA). This EUA will remain  in effect (meaning this test can be used) for the duration of the COVID-19 declaration under Section 56 4(b)(1) of the Act, 21 U.S.C. section 360bbb-3(b)(1), unless the authorization is terminated or revoked sooner. Performed at [facility], [address] [place], [place] [address]   Urine culture     Status: None   Collection Time: 08/15/19  5:39 AM   Specimen: In/Out Cath Urine  Result Value Ref Range Status   Specimen Description IN/OUT CATH URINE  Final   Special Requests NONE  Final   Culture   Final    NO GROWTH Performed at [facility], [address] [place], [place] [address]    Report Status 08/16/2019 FINAL  Final  Respiratory Panel by PCR     Status: None   Collection Time: 08/15/19 11:15 PM   Specimen: Nasopharyngeal Swab; Respiratory  Result Value Ref Range Status   Adenovirus NOT DETECTED NOT DETECTED Final   Coronavirus 229E NOT DETECTED NOT DETECTED Final    Comment: (NOTE) The Coronavirus on the Respiratory Panel, DOES NOT test for the novel  Coronavirus (2019 nCoV)    Coronavirus HKU1 NOT DETECTED NOT DETECTED Final   Coronavirus NL63 NOT DETECTED NOT DETECTED Final   Coronavirus OC43 NOT DETECTED NOT DETECTED Final   Metapneumovirus NOT DETECTED NOT DETECTED Final   Rhinovirus / Enterovirus NOT DETECTED NOT DETECTED Final   Influenza A NOT DETECTED NOT DETECTED  Final   Influenza B NOT DETECTED NOT DETECTED Final   Parainfluenza Virus 1 NOT DETECTED NOT DETECTED Final   Parainfluenza Virus 2 NOT DETECTED NOT DETECTED Final   Parainfluenza Virus 3 NOT DETECTED NOT DETECTED Final   Parainfluenza Virus 4 NOT DETECTED NOT DETECTED Final   Respiratory Syncytial Virus NOT DETECTED NOT DETECTED Final   Bordetella pertussis NOT DETECTED NOT DETECTED Final   Chlamydophila pneumoniae NOT DETECTED NOT DETECTED Final   Mycoplasma pneumoniae NOT DETECTED NOT  DETECTED Final    Comment: Performed at [facility], [address] [place], [place] [address]  SARS CORONAVIRUS 2 (TAT 6-24 HRS) Nasopharyngeal Nasopharyngeal Swab     Status: None   Collection Time: 08/16/19  4:26 PM   Specimen: Nasopharyngeal Swab  Result Value Ref Range Status   SARS Coronavirus 2 NEGATIVE NEGATIVE Final    Comment: (NOTE) SARS-CoV-2 target nucleic acids are NOT DETECTED. The SARS-CoV-2 RNA is generally detectable in upper and lower respiratory specimens during the acute phase of infection. Negative results do not preclude SARS-CoV-2 infection, do not rule out co-infections with other pathogens, and should not be used as the sole basis for treatment or other patient management decisions. Negative results must be combined with clinical observations, patient history, and epidemiological information. The expected result is Negative. Fact Sheet for Patients: HairSlick.no Fact Sheet for Healthcare Providers: quierodirigir.com This test is not yet approved or cleared by the [place] FDA and  has been authorized for detection and/or diagnosis of SARS-CoV-2 by FDA under an Emergency Use Authorization (EUA). This EUA will remain  in effect (meaning this test can be used) for the duration of the COVID-19 declaration under Section 56 4(b)(1) of the Act, 21 U.S.C. section 360bbb-3(b)(1), unless the authorization is  terminated or revoked sooner. Performed at [facility], [address], [place], [place] [address]      Labs: BNP (last 3 results) Recent Labs    06/28/19 2213 08/15/19 0843  BNP 53.2 27.5   Basic Metabolic Panel: Recent Labs  Lab 08/15/19 0202 08/16/19 0052 08/17/19 0400  NA 139 142 139  K 3.6 4.3 4.3  CL 102 104 100  CO2 GLUCOSE 128* 153* 167*  BUN CREATININE 1.20 1.06 1.09  CALCIUM 8.7* 8.5* 8.7*  MG  --  2.3 2.6*  PHOS  --  2.7 4.2   Liver Function Tests: Recent Labs  Lab 08/15/19 0202 08/16/19 0052 08/17/19 0400  AST ALT 32 26 29  ALKPHOS 30* 28* 27*  BILITOT 0.6 0.5 0.7  PROT 7.1 6.6 6.1*  ALBUMIN 3.3* 2.9* 2.9*   No results for input(s): LIPASE, AMYLASE in the last 168 hours. No results for input(s): AMMONIA in the last 168 hours. CBC: Recent Labs  Lab 08/15/19 0202 08/16/19 0052 08/17/19 0400  WBC 8.0 5.8 10.0  NEUTROABS 6.6 5.0 8.6*  HGB 14.9 13.7 13.9  HCT 44.7 41.1 42.3  MCV 88.0 87.4 87.8  PLT 262 286 367   Cardiac Enzymes: No results for input(s): CKTOTAL, CKMB, CKMBINDEX, TROPONINI in the last 168 hours. BNP: Invalid input(s): POCBNP CBG: No results for input(s): GLUCAP in the last 168 hours. D-Dimer Recent Labs    08/16/19 0052 08/17/19 0400  DDIMER 1.21* 0.97*   Hgb A1c No results for input(s): HGBA1C in the last 72 hours. Lipid Profile No results for input(s): CHOL, HDL, LDLCALC, TRIG, CHOLHDL, LDLDIRECT in the last 72 hours. Thyroid function studies No results for input(s): TSH, T4TOTAL, T3FREE, THYROIDAB in the last 72 hours.  Invalid input(s): FREET3 Anemia work up Recent Labs    08/16/19 0052 08/17/19 0400  FERRITIN 435* 435*   Urinalysis    Component Value Date/Time   COLORURINE YELLOW 08/15/2019 0408   APPEARANCEUR CLEAR 08/15/2019 0408   LABSPEC 1.025 08/15/2019 0408   PHURINE 5.0 08/15/2019 0408   GLUCOSEU NEGATIVE 08/15/2019 0408   HGBUR NEGATIVE 08/15/2019  0408   BILIRUBINUR  NEGATIVE 08/15/2019 0408   KETONESUR NEGATIVE 08/15/2019 0408   PROTEINUR 30 (A) 08/15/2019 0408   NITRITE NEGATIVE 08/15/2019 0408   LEUKOCYTESUR NEGATIVE 08/15/2019 0408   Sepsis Labs Invalid input(s): PROCALCITONIN,  WBC,  LACTICIDVEN Microbiology Recent Results (from the past 240 hour(s))  Culture, blood (routine x 2)     Status: None (Preliminary result)   Collection Time: 08/15/19  2:49 AM   Specimen: BLOOD  Result Value Ref Range Status   Specimen Description BLOOD RIGHT ANTECUBITAL  Final   Special Requests   Final    BOTTLES DRAWN AEROBIC AND ANAEROBIC Blood Culture adequate volume   Culture   Final    NO GROWTH 2 DAYS Performed at [facility], [address] [place], [place] [address]    Report Status PENDING  Incomplete  Culture, blood (routine x 2)     Status: None (Preliminary result)   Collection Time: 08/15/19  2:49 AM   Specimen: BLOOD RIGHT FOREARM  Result Value Ref Range Status   Specimen Description BLOOD RIGHT FOREARM  Final   Special Requests   Final    BOTTLES DRAWN AEROBIC AND ANAEROBIC Blood Culture adequate volume   Culture   Final    NO GROWTH 2 DAYS Performed at [facility], [place] [address], [place], [place] [address]    Report Status PENDING  Incomplete  SARS CORONAVIRUS 2 (TAT 6-24 HRS) Nasopharyngeal Nasopharyngeal Swab     Status: None   Collection Time: 08/15/19  3:45 AM   Specimen: Nasopharyngeal Swab  Result Value Ref Range Status   SARS Coronavirus 2 NEGATIVE NEGATIVE Final    Comment: (NOTE) SARS-CoV-2 target nucleic acids are NOT DETECTED. The SARS-CoV-2 RNA is generally detectable in upper and lower respiratory specimens during the acute phase of infection. Negative results do not preclude SARS-CoV-2 infection, do not rule out co-infections with other pathogens, and should not be used as the sole basis for treatment or other patient management decisions. Negative results must be combined with  clinical observations, patient history, and epidemiological information. The expected result is Negative. Fact Sheet for Patients: SugarRoll.be Fact Sheet for Healthcare Providers: https://www.woods-mathews.com/ This test is not yet approved or cleared by the [place] FDA and  has been authorized for detection and/or diagnosis of SARS-CoV-2 by FDA under an Emergency Use Authorization (EUA). This EUA will remain  in effect (meaning this test can be used) for the duration of the COVID-19 declaration under Section 56 4(b)(1) of the Act, 21 U.S.C. section 360bbb-3(b)(1), unless the authorization is terminated or revoked sooner. Performed at [facility], [place] [address] [place], [place] [address]   Urine culture     Status: None   Collection Time: 08/15/19  5:39 AM   Specimen: In/Out Cath Urine  Result Value Ref Range Status   Specimen Description IN/OUT CATH URINE  Final   Special Requests NONE  Final   Culture   Final    NO GROWTH Performed at [facility], [place] [address], [place], [place] [address]    Report Status 08/16/2019 FINAL  Final  Respiratory Panel by PCR     Status: None   Collection Time: 08/15/19 11:15 PM   Specimen: Nasopharyngeal Swab; Respiratory  Result Value Ref Range Status   Adenovirus NOT DETECTED NOT DETECTED Final   Coronavirus 229E NOT DETECTED NOT DETECTED Final    Comment: (NOTE) The Coronavirus on the Respiratory Panel, DOES NOT test for the novel  Coronavirus (  2019 nCoV)    Coronavirus HKU1 NOT DETECTED NOT DETECTED Final   Coronavirus NL63 NOT DETECTED NOT DETECTED Final   Coronavirus OC43 NOT DETECTED NOT DETECTED Final   Metapneumovirus NOT DETECTED NOT DETECTED Final   Rhinovirus / Enterovirus NOT DETECTED NOT DETECTED Final   Influenza A NOT DETECTED NOT DETECTED Final   Influenza B NOT DETECTED NOT DETECTED Final   Parainfluenza Virus 1 NOT DETECTED NOT DETECTED Final    Parainfluenza Virus 2 NOT DETECTED NOT DETECTED Final   Parainfluenza Virus 3 NOT DETECTED NOT DETECTED Final   Parainfluenza Virus 4 NOT DETECTED NOT DETECTED Final   Respiratory Syncytial Virus NOT DETECTED NOT DETECTED Final   Bordetella pertussis NOT DETECTED NOT DETECTED Final   Chlamydophila pneumoniae NOT DETECTED NOT DETECTED Final   Mycoplasma pneumoniae NOT DETECTED NOT DETECTED Final    Comment: Performed at [facility], [address], [place], [place] [address]  SARS CORONAVIRUS 2 (TAT 6-24 HRS) Nasopharyngeal Nasopharyngeal Swab     Status: None   Collection Time: 08/16/19  4:26 PM   Specimen: Nasopharyngeal Swab  Result Value Ref Range Status   SARS Coronavirus 2 NEGATIVE NEGATIVE Final    Comment: (NOTE) SARS-CoV-2 target nucleic acids are NOT DETECTED. The SARS-CoV-2 RNA is generally detectable in upper and lower respiratory specimens during the acute phase of infection. Negative results do not preclude SARS-CoV-2 infection, do not rule out co-infections with other pathogens, and should not be used as the sole basis for treatment or other patient management decisions. Negative results must be combined with clinical observations, patient history, and epidemiological information. The expected result is Negative. Fact Sheet for Patients: HairSlick.no Fact Sheet for Healthcare Providers: quierodirigir.com This test is not yet approved or cleared by the [place] FDA and  has been authorized for detection and/or diagnosis of SARS-CoV-2 by FDA under an Emergency Use Authorization (EUA). This EUA will remain  in effect (meaning this test can be used) for the duration of the COVID-19 declaration under Section 56 4(b)(1) of the Act, 21 U.S.C. section 360bbb-3(b)(1), unless the authorization is terminated or revoked sooner. Performed at [facility], [address], [place], [place] [address]       Time coordinating discharge: 32 minutes  SIGNED:   Dorcas Carrow, MD  [facility] 08/17/2019, 4:04 PM

## 2019-08-18 ENCOUNTER — Encounter (INDEPENDENT_AMBULATORY_CARE_PROVIDER_SITE_OTHER): Payer: Self-pay

## 2019-08-19 ENCOUNTER — Encounter (INDEPENDENT_AMBULATORY_CARE_PROVIDER_SITE_OTHER): Payer: Self-pay

## 2019-08-19 NOTE — Telephone Encounter (Signed)
Please schedule Mr. Keesey for a virtual visit for follow up. Thank you.

## 2019-08-20 ENCOUNTER — Encounter (INDEPENDENT_AMBULATORY_CARE_PROVIDER_SITE_OTHER): Payer: Self-pay

## 2019-08-20 LAB — CULTURE, BLOOD (ROUTINE X 2)
Culture: NO GROWTH
Culture: NO GROWTH
Special Requests: ADEQUATE
Special Requests: ADEQUATE

## 2019-08-21 ENCOUNTER — Encounter (INDEPENDENT_AMBULATORY_CARE_PROVIDER_SITE_OTHER): Payer: Self-pay

## 2019-08-22 ENCOUNTER — Encounter (INDEPENDENT_AMBULATORY_CARE_PROVIDER_SITE_OTHER): Payer: Self-pay

## 2019-08-23 ENCOUNTER — Encounter (INDEPENDENT_AMBULATORY_CARE_PROVIDER_SITE_OTHER): Payer: Self-pay

## 2019-08-24 ENCOUNTER — Encounter (INDEPENDENT_AMBULATORY_CARE_PROVIDER_SITE_OTHER): Payer: Self-pay

## 2019-08-25 ENCOUNTER — Telehealth (INDEPENDENT_AMBULATORY_CARE_PROVIDER_SITE_OTHER): Payer: BC Managed Care – PPO | Admitting: Cardiology

## 2019-08-25 ENCOUNTER — Encounter (INDEPENDENT_AMBULATORY_CARE_PROVIDER_SITE_OTHER): Payer: Self-pay

## 2019-08-25 ENCOUNTER — Encounter: Payer: Self-pay | Admitting: Cardiology

## 2019-08-25 VITALS — Ht 75.0 in | Wt 278.0 lb

## 2019-08-25 DIAGNOSIS — Z09 Encounter for follow-up examination after completed treatment for conditions other than malignant neoplasm: Secondary | ICD-10-CM | POA: Diagnosis not present

## 2019-08-25 DIAGNOSIS — I2510 Atherosclerotic heart disease of native coronary artery without angina pectoris: Secondary | ICD-10-CM | POA: Diagnosis not present

## 2019-08-25 DIAGNOSIS — Z8701 Personal history of pneumonia (recurrent): Secondary | ICD-10-CM | POA: Diagnosis not present

## 2019-08-25 DIAGNOSIS — I25110 Atherosclerotic heart disease of native coronary artery with unstable angina pectoris: Secondary | ICD-10-CM

## 2019-08-25 DIAGNOSIS — Z20822 Contact with and (suspected) exposure to covid-19: Secondary | ICD-10-CM

## 2019-08-25 DIAGNOSIS — J189 Pneumonia, unspecified organism: Secondary | ICD-10-CM

## 2019-08-25 NOTE — Patient Instructions (Signed)
Medication Instructions:  Your Physician recommend you continue on your current medication as directed.    *If you need a refill on your cardiac medications before your next appointment, please call your pharmacy*  Lab Work: Continue with plan to have labs drawn in February (Fasting lipids).   Testing/Procedures: None  Follow-Up: At [facility], you and your health needs are our priority.  As part of our continuing mission to provide you with exceptional heart care, we have created designated Provider Care Teams.  These Care Teams include your primary Cardiologist (physician) and Advanced Practice Providers (APPs -  Physician Assistants and Nurse Practitioners) who all work together to provide you with the care you need, when you need it.  Your next appointment:   5 month(s)  The format for your next appointment:   In Person  Provider:   Buford Dresser, MD

## 2019-08-25 NOTE — Progress Notes (Signed)
Virtual Visit via Video Note   This visit type was conducted due to national recommendations for restrictions regarding the COVID-19 Pandemic (e.g. social distancing) in an effort to limit this patient's exposure and mitigate transmission in our community.  Due to his co-morbid illnesses, this patient is at least at moderate risk for complications without adequate follow up.  This format is felt to be most appropriate for this patient at this time.  All issues noted in this document were discussed and addressed.  A limited physical exam was performed with this format.  Please refer to the patient's chart for his consent to telehealth for [facility].   Date:  08/25/2019   ID:  Randall Mullen, DOB Jun 26, 1971, MRN [id number]  Patient Location: Home Provider Location: Home  PCP:  Randall Maple, MD  Cardiologist:  Randall Dresser, MD  Electrophysiologist:  None   Evaluation Performed:  Follow-Up Visit  Chief Complaint:  Post discharge follow up  History of Present Illness:    Randall Mullen is a 48 y.o. male with PMH CAD s/p stent 06/2019, hyperlipidemia, gout. He is seen today for 14 day post hospital discharge transition of care follow up. Review of events in hospital and full medication reconciliation done today.  He was discharged on 08/17/19 after admission for shortness of breath, with high suspicion for Covid infection. Both his wife and son tested positive for Covid. Patient had fevers to 105 F at home, bilateral pulmonary infiltrates, cough, O2 desaturations requiring new O2, loss of sense of taste, elevated d dimer, elevated CRP, increased LDH, increased ferritin, increased fibrinogen, normal procalcitonin. He had multiple negative Covid tests and was treated as possible covid with community acquired pneumonia. He was treated with steroids and antibiotics. Echo was normal. Please see chart re: mychart messages sent during his hospitalization.  Patient and his wife  with several questions: -was this heart failure? He received lasix, and on his report it mentioned heart failure. They are very concerned about this. Discussed today. Reviewed echo results, chest x-ray,labs, etc. Highly suggestive of Covid even with negative tests, treated for community acquired pneumonia. Echo actually read as improved compared to prior. BNP was normal.   No chest pain, had otherwise felt like he was doing well. Tolerating meds. Has follow up with PCP later this am. Has some concerns with hiccups, urinary incontinence that I recommended he discussed with PCP.  Denies chest pain, PND, orthopnea, LE edema or unexpected weight gain. No syncope or palpitations.  Discussed gradual, slow return to activity.  Past Medical History:  Diagnosis Date  . GERD (gastroesophageal reflux disease)   . Gout   . HLD (hyperlipidemia)    Past Surgical History:  Procedure Laterality Date  . CORONARY STENT INTERVENTION N/A 06/30/2019   Procedure: CORONARY STENT INTERVENTION;  Surgeon: Randall Blanks, MD;  Location: [place] CV [facility];  Service: Cardiovascular;  Laterality: N/A;  . LEFT HEART CATH AND CORONARY ANGIOGRAPHY N/A 06/29/2019   Procedure: LEFT HEART CATH AND CORONARY ANGIOGRAPHY;  Surgeon: Randall Blanks, MD;  Location: [place] CV LAB;  Service: Cardiovascular;  Laterality: N/A;  . VENTRAL HERNIA REPAIR       Current Meds  Medication Sig  . aspirin EC 81 MG tablet Take 81 mg by mouth daily.  Randall Mullen atorvastatin (LIPITOR) 40 MG tablet Take 40 mg by mouth daily at 6 PM.  . indomethacin (INDOCIN) 25 MG capsule Take 25 mg by mouth daily as needed. Gout flare-up  . nitroGLYCERIN (NITROSTAT) 0.4  MG SL tablet Place 1 tablet (0.4 mg total) under the tongue every 5 (five) minutes as needed for up to 15 days for chest pain.  Randall Mullen omeprazole (PRILOSEC) 40 MG capsule Take 40 mg by mouth daily as needed (reflux/heartburn).   Randall Mullen TADALAFIL PO Take 9-18 mg by mouth as needed (ED).  (chewable tablets)  . ticagrelor (BRILINTA) 90 MG TABS tablet Take 1 tablet (90 mg total) by mouth 2 (two) times daily.  . Vitamin D, Ergocalciferol, (DRISDOL) 1.25 MG (50000 UT) CAPS capsule Take 50,000 Units by mouth every Sunday.      Allergies:   Patient has no active allergies.   Social History   Tobacco Use  . Smoking status: Never Smoker  . Smokeless tobacco: Never Used  Substance Use Topics  . Alcohol use: Yes  . Drug use: Never     Family Hx: The patient's family history includes CAD in his father; Heart attack in his paternal grandmother.  ROS:   Please see the history of present illness.    Constitutional: no further fevers HENT: Negative for hearing loss.   Eyes: Negative for loss of vision.  Respiratory: Still short of breath, improved from initial Cardiovascular: See HPI. Gastrointestinal: Negative for abdominal pain, melena, and hematochezia.  Genitourinary: Positive for urinary incontinence  Musculoskeletal: Negative for falls and myalgias.  Skin: Negative for itching and rash.  Neurological: Negative for focal weakness, focal sensory changes and loss of consciousness.  Endo/Heme/Allergies: Does not bruise/bleed easily.  All other systems reviewed and are negative.   Prior CV studies:   The following studies were reviewed today: Echo 08/17/19 personally reviewed.  Labs/Other Tests and Data Reviewed:    EKG:  An ECG dated 08/15/19 was personally reviewed today and demonstrated:  sinus tach at 105  Recent Labs: 08/15/2019: B Natriuretic Peptide 27.5 08/17/2019: ALT 29; BUN 19; Creatinine, Ser 1.09; Hemoglobin 13.9; Magnesium 2.6; Platelets 367; Potassium 4.3; Sodium 139   Recent Lipid Panel No results found for: CHOL, TRIG, HDL, CHOLHDL, LDLCALC, LDLDIRECT  Wt Readings from Last 3 Encounters:  08/25/19 278 lb (126.1 kg)  08/15/19 270 lb (122.5 kg)  07/22/19 296 lb 9.6 oz (134.5 kg)     Objective:    Vital Signs:  Ht 6\' 3"  (1.905 m)   Wt 278 lb  (126.1 kg)   BMI 34.75 kg/m    VITAL SIGNS:  reviewed GEN:  no acute distress EYES:  sclerae anicteric, EOMI - Extraocular Movements Intact RESPIRATORY:  normal respiratory effort, symmetric expansion CARDIOVASCULAR:  no visible JVD SKIN:  no rash, lesions or ulcers. MUSCULOSKELETAL:  no obvious deformities. NEURO:  alert and oriented x 3, no obvious focal deficit PSYCH:  normal affect  ASSESSMENT & PLAN:    Post hospitalization follow up for suspected Covid and presumed hospital acquired pneumonia: Feels that he is improving slowly. He was very concerned this was heart failure. No evidence of this. Normal echo and BNP. Has follow up with his PCP later today. Completed antibiotics and steroids. Cautioned on gradual return to activity.  CAD: asymptomatic, normal (to slightly improved echo). No change today  COVID-19 Education: The signs and symptoms of COVID-19 were discussed with the patient, as well as appropriate quarantine time.  The importance of social distancing was discussed today.  Time:   Today, I have spent 15 minutes with the patient with telehealth technology discussing the above problems.    Patient Instructions  Medication Instructions:  Your Physician recommend you continue on your current medication  as directed.    *If you need a refill on your cardiac medications before your next appointment, please call your pharmacy*  Lab Work: Continue with plan to have labs drawn in February (Fasting lipids).   Testing/Procedures: None  Follow-Up: At [facility], you and your health needs are our priority.  As part of our continuing mission to provide you with exceptional heart care, we have created designated Provider Care Teams.  These Care Teams include your primary Cardiologist (physician) and Advanced Practice Providers (APPs -  Physician Assistants and Nurse Practitioners) who all work together to provide you with the care you need, when you need it.  Your next  appointment:   5 month(s)  The format for your next appointment:   In Person  Provider:   Jodelle RedBridgette Dock Baccam, MD      Follow Up: as previously scheduled, lipids in 10/2018 and follow up 01/2019  Signed, Jodelle RedBridgette Lakeesha Fontanilla, MD  08/25/2019 8:26 AM    [facility]

## 2019-08-26 ENCOUNTER — Encounter (INDEPENDENT_AMBULATORY_CARE_PROVIDER_SITE_OTHER): Payer: Self-pay

## 2019-08-27 ENCOUNTER — Encounter (INDEPENDENT_AMBULATORY_CARE_PROVIDER_SITE_OTHER): Payer: Self-pay

## 2019-08-28 ENCOUNTER — Encounter (INDEPENDENT_AMBULATORY_CARE_PROVIDER_SITE_OTHER): Payer: Self-pay

## 2019-08-29 ENCOUNTER — Encounter (INDEPENDENT_AMBULATORY_CARE_PROVIDER_SITE_OTHER): Payer: Self-pay

## 2019-08-30 ENCOUNTER — Encounter (INDEPENDENT_AMBULATORY_CARE_PROVIDER_SITE_OTHER): Payer: Self-pay

## 2019-10-20 ENCOUNTER — Ambulatory Visit: Payer: BC Managed Care – PPO | Admitting: Physician Assistant

## 2019-10-20 ENCOUNTER — Ambulatory Visit
Admission: RE | Admit: 2019-10-20 | Discharge: 2019-10-20 | Disposition: A | Discharge status: Home / Self Care | Payer: BC Managed Care – PPO | Source: Ambulatory Visit | Attending: Physician Assistant | Admitting: Physician Assistant

## 2019-10-20 ENCOUNTER — Other Ambulatory Visit: Payer: Self-pay

## 2019-10-20 ENCOUNTER — Encounter: Payer: Self-pay | Admitting: Physician Assistant

## 2019-10-20 VITALS — BP 124/102 | HR 85 | Ht 75.0 in | Wt 296.0 lb

## 2019-10-20 DIAGNOSIS — E781 Pure hyperglyceridemia: Secondary | ICD-10-CM

## 2019-10-20 DIAGNOSIS — R079 Chest pain, unspecified: Secondary | ICD-10-CM | POA: Diagnosis not present

## 2019-10-20 DIAGNOSIS — I25119 Atherosclerotic heart disease of native coronary artery with unspecified angina pectoris: Secondary | ICD-10-CM

## 2019-10-20 DIAGNOSIS — R7303 Prediabetes: Secondary | ICD-10-CM

## 2019-10-20 MED ORDER — METOPROLOL TARTRATE 25 MG PO TABS: 12.5000 mg | ORAL_TABLET | Freq: Two times a day (BID) | ORAL | 1 refills | Status: DC

## 2019-10-20 NOTE — Telephone Encounter (Signed)
Spoke with pt who report he's been experiencing intermittent  chest pain for the past few days but denies active pain at the moment . He describe symptom as a burning sensation. Pt report he can walk on treadmill for 45 mins with no issue but notices symptoms more at rest. He report last episode was last night which was resolved with ASA. Pt also report an episode of SOB yesterday but no CP at that time.  Nurse recommended pt schedule an appointment for further evaluations. Appointment scheduled for today 2/4 with Azalee Course, PA.

## 2019-10-20 NOTE — Patient Instructions (Addendum)
Medication Instructions:  START Metoprolol 12.5mg  Take 1 tablet twice a day (tablet may come in 25mg  if so cut in half and take half tablet twice a day) *If you need a refill on your cardiac medications before your next appointment, please call your pharmacy*  Lab Work: Your physician recommends that you return for lab work in: at your next appt- LIPID, LIPOPROTEIN A, LFT If you have labs (blood work) drawn today and your tests are completely normal, you will receive your results only by: MyChart Message (if you have MyChart) OR . A paper copy in the mail If you have any lab test that is abnormal or we need to change your treatment, we will call you to review the results.  Testing/Procedures: A chest x-ray takes a picture of the organs and structures inside the chest, including the heart, lungs, and blood vessels. This test can show several things, including, whether the heart is enlarges; whether fluid is building up in the lungs; and whether pacemaker / defibrillator leads are still in place. COMPLETE WHERE IS CONVENIENT FOR YOU  Follow-Up: At [facility], you and your health needs are our priority.  As part of our continuing mission to provide you with exceptional heart care, we have created designated Provider Care Teams.  These Care Teams include your primary Cardiologist (physician) and Advanced Practice Providers (APPs -  Physician Assistants and Nurse Practitioners) who all work together to provide you with the care you need, when you need it.  Your next appointment:   2 week(s)  The format for your next appointment:   In Person  Provider:   [facility], MD Jodelle Red, PA-C  Other Instructions

## 2019-10-20 NOTE — Progress Notes (Signed)
Cardiology Office Note:    Date:  10/22/2019   ID:  Randall Mullen, DOB 1971/02/14, MRN [id number]  PCP:  Judge Stall, MD  Cardiologist:  Jodelle Red, MD  Electrophysiologist:  None   Referring MD: Judge Stall, MD   Chief Complaint  Patient presents with  . Follow-up    seen for Dr. Cristal Deer    History of Present Illness:    Randall Mullen is a 49 y.o. male with a hx of hyperlipidemia, gout, GERD, prediabetes and recently diagnosed CAD.  He has family history of early CAD with his father having 6 cardiac catheterizations in the past.  He presented to [facility] in October 2020 with chest pain.  Myoview demonstrated moderate inferolateral reversible changes consistent with ischemia, EF 37%.  He was subsequently transferred to [facility] for further evaluation.  Echocardiogram obtained on 06/29/2019 showed EF 55 to 60%, hypokinesis in the basal inferolateral and basal inferior segment, moderate LAE.  Cardiac catheterization performed on 06/29/2019 revealed 80% proximal RCA lesion, 99% RPDA V lesion, 50% ostial left circumflex lesion, 99% proximal LAD lesion, 100% mid to distal left circumflex occlusion.  He returned to the Cath Lab on 06/29/2019 for staged intervention and underwent DES to RPAV, LAD and RCA.  Prior to the procedure, he was loaded on Brilinta.  Postprocedure, he was instructed to continue aspirin and Brilinta for a minimum of 1 year.  Hemoglobin A1c obtained during the admission was 6.2 which placed the patient in the prediabetes range.  Unfortunately both his wife and his son tested positive for Covid on 07/29/2019.  He was tested negative for Covid on 11/17 however he had Covid-like symptoms including fever, cough, and loss of taste.  He went back to the ED on 08/15/2019 with what appears to be a panic attack.  Chest x-ray showed bilateral infiltrate, O2 saturation was 92% on room air.  Her temperature was 102.  He was started on  Rocephin and azithromycin in the ED.  PCR and point-of-care Covid test came back negative again on 11/30.  Respiratory virus panel was also negative.  D-dimer was positive.  She was discharged on 7 days of Augmentin and dexamethasone.  Patient presents today for cardiology office visit.  His blood pressure is normal, he does occasionally notice some chest discomfort.  I recommended a repeat chest x-ray to follow-up on the bilateral infiltrate that was previously seen in November.  His chest discomfort is somewhat atypical.  I recommend the addition of low-dose beta-blocker as antianginal therapy.  He is overdue for fasting lipid panel and lipoprotein a.  I plan to bring the patient back in 2 weeks for reassessment.  Otherwise I do not see any lower extremity edema, orthopnea or PND.  Past Medical History:  Diagnosis Date  . GERD (gastroesophageal reflux disease)   . Gout   . HLD (hyperlipidemia)     Past Surgical History:  Procedure Laterality Date  . CORONARY STENT INTERVENTION N/A 06/30/2019   Procedure: CORONARY STENT INTERVENTION;  Surgeon: Kathleene Hazel, MD;  Location: [facility] LAB;  Service: Cardiovascular;  Laterality: N/A;  . LEFT HEART CATH AND CORONARY ANGIOGRAPHY N/A 06/29/2019   Procedure: LEFT HEART CATH AND CORONARY ANGIOGRAPHY;  Surgeon: Kathleene Hazel, MD;  Location: [facility] INVASIVE CV LAB;  Service: Cardiovascular;  Laterality: N/A;  . VENTRAL HERNIA REPAIR      Current Medications: Current Meds  Medication Sig  . aspirin EC 81 MG tablet Take 81 mg by mouth daily.  Marland Kitchen  atorvastatin (LIPITOR) 40 MG tablet Take 40 mg by mouth daily at 6 PM.  . indomethacin (INDOCIN) 25 MG capsule Take 25 mg by mouth daily as needed. Gout flare-up  . omeprazole (PRILOSEC) 40 MG capsule Take 40 mg by mouth daily as needed (reflux/heartburn).   Marland Kitchen TADALAFIL PO Take 9-18 mg by mouth as needed (ED). (chewable tablets)  . ticagrelor (BRILINTA) 90 MG TABS tablet Take 1 tablet (90  mg total) by mouth 2 (two) times daily.  . Vitamin D, Ergocalciferol, (DRISDOL) 1.25 MG (50000 UT) CAPS capsule Take 50,000 Units by mouth every Sunday.      Allergies:   Patient has no active allergies.   Social History   Socioeconomic History  . Marital status: Married    Spouse name: Not on file  . Number of children: Not on file  . Years of education: Not on file  . Highest education level: Not on file  Occupational History  . Not on file  Tobacco Use  . Smoking status: Never Smoker  . Smokeless tobacco: Never Used  Substance and Sexual Activity  . Alcohol use: Yes  . Drug use: Never  . Sexual activity: Not on file  Other Topics Concern  . Not on file  Social History Narrative  . Not on file   Social Determinants of Health   Financial Resource Strain:   . Difficulty of Paying Living Expenses: Not on file  Food Insecurity:   . Worried About Charity fundraiser in the Last Year: Not on file  . Ran Out of Food in the Last Year: Not on file  Transportation Needs:   . Lack of Transportation (Medical): Not on file  . Lack of Transportation (Non-Medical): Not on file  Physical Activity:   . Days of Exercise per Week: Not on file  . Minutes of Exercise per Session: Not on file  Stress:   . Feeling of Stress : Not on file  Social Connections: Unknown  . Frequency of Communication with Friends and Family: Not on file  . Frequency of Social Gatherings with Friends and Family: Not on file  . Attends Religious Services: Not on file  . Active Member of Clubs or Organizations: Not on file  . Attends Archivist Meetings: Not on file  . Marital Status: Married     Family History: The patient's family history includes CAD in his father; Heart attack in his paternal grandmother.  ROS:   Please see the history of present illness.     All other systems reviewed and are negative.  EKGs/Labs/Other Studies Reviewed:    The following studies were reviewed  today:  Cath 06/29/2019  Prox RCA lesion is 80% stenosed.  RPAV lesion is 99% stenosed.  Ost Cx to Prox Cx lesion is 50% stenosed.  Prox LAD lesion is 99% stenosed.  Mid Cx to Dist Cx lesion is 100% stenosed.   1. Severe three vessel CAD 2. Severe stenosis proximal LAD. The mid and distal LAD fills briskly from antegrade flow but also seen to fill from faint right to left collaterals.  3. Moderate disease in the proximal Circumflex artery with chronic occlusion of the mid Circumflex just after the takeoff of the large obtuse marginal branch 4. Severe focal stenosis in the proximal segment of the large dominant RCA. Severe stenosis in the the proximal segment of the moderate caliber posterolateral artery 5. Normal filling pressures.   Recommendations: Options for treatment of multi-vessel CAD include CABG vs multi-vessel  stenting. Given his young age and focal nature of the disease in his RCA and LAD, I feel that stenting is the best long term option for treatment.  I have reviewed with my interventional colleagues this am and they agree. I have discussed this with the patient and his wife and they agree. Will load with Brilinta 180 mg po x 1 today. Plan PCI of the RCA and LAD tomorrow.    Cath 06/30/2019  Prox LAD lesion is 99% stenosed.  Ost Cx to Prox Cx lesion is 50% stenosed.  Mid Cx to Dist Cx lesion is 100% stenosed.  Prox RCA lesion is 80% stenosed.  RPAV lesion is 99% stenosed.  A drug-eluting stent was successfully placed using a STENT RESOLUTE ONYX 3.5X26.  Post intervention, there is a 0% residual stenosis.  A drug-eluting stent was successfully placed using a STENT RESOLUTE ONYX 2.25X30.  Post intervention, there is a 0% residual stenosis.  A drug-eluting stent was successfully placed using a STENT RESOLUTE ONYX 4.0X18.  Post intervention, there is a 0% residual stenosis.   1. Successful PTCA/DES x 1 proximal LAD 2. Successful PTCA/DES x 1 posterolateral  artery 3. Successful PTCA/DES x 1 proximal RCA  Will continue DAPT with ASA and Brilinta for one year. Continue statin and beta blocker. Likely d/c home tomorrow after hydration post dye load. BMET in am.    EKG:  EKG is  ordered today.  The ekg ordered today demonstrates NSR with TWI in lateral leads  Recent Labs: 08/15/2019: B Natriuretic Peptide 27.5 08/17/2019: ALT 29; BUN 19; Creatinine, Ser 1.09; Hemoglobin 13.9; Magnesium 2.6; Platelets 367; Potassium 4.3; Sodium 139  Recent Lipid Panel No results found for: CHOL, TRIG, HDL, CHOLHDL, VLDL, LDLCALC, LDLDIRECT  Physical Exam:    VS:  BP (!) 124/102   Pulse 85   Ht 6\' 3"  (1.905 m)   Wt 296 lb (134.3 kg)   SpO2 99%   BMI 37.00 kg/m     Wt Readings from Last 3 Encounters:  10/20/19 296 lb (134.3 kg)  08/25/19 278 lb (126.1 kg)  08/15/19 270 lb (122.5 kg)     GEN:  Well nourished, well developed in no acute distress HEENT: Normal NECK: No JVD; No carotid bruits LYMPHATICS: No lymphadenopathy CARDIAC: RRR, no murmurs, rubs, gallops RESPIRATORY:  Clear to auscultation without rales, wheezing or rhonchi  ABDOMEN: Soft, non-tender, non-distended MUSCULOSKELETAL:  No edema; No deformity  SKIN: Warm and dry NEUROLOGIC:  Alert and oriented x 3 PSYCHIATRIC:  Normal affect   ASSESSMENT:    1. Chest pain of uncertain etiology   2. Pure hypertriglyceridemia   3. Coronary artery disease involving native coronary artery of native heart with angina pectoris (HCC)   4. Prediabetes    PLAN:    In order of problems listed above:  1. Chest pain: Add low-dose metoprolol 12.5 mg twice daily to his medical regimen.  Continue aspirin, Brilinta and Lipitor  2. CAD: Last PCI was in October 2020.  Continue aspirin and Brilinta  3. Hyperlipidemia: Obtain fasting lipid panel, liver function panel and lipoprotein a  4. Abnormal chest x-ray: He presented with Covid-like symptom in November, even though he was tested negative, both his  wife and his son were tested positive.  I will obtain a repeat chest x-ray to make sure his bilateral infiltrate improve  5. Prediabetes: Followed by primary care provider   Medication Adjustments/Labs and Tests Ordered: Current medicines are reviewed at length with the patient today.  Concerns regarding medicines are outlined above.  Orders Placed This Encounter  Procedures  . DG Chest 2 View  . Lipid panel  . Lipoprotein A (LPA)  . Hepatic function panel  . EKG 12-Lead   Meds ordered this encounter  Medications  . metoprolol tartrate (LOPRESSOR) 25 MG tablet    Sig: Take 0.5 tablets (12.5 mg total) by mouth 2 (two) times daily.    Dispense:  90 tablet    Refill:  1    Patient Instructions  Medication Instructions:  START Metoprolol 12.5mg  Take 1 tablet twice a day (tablet may come in 25mg  if so cut in half and take half tablet twice a day) *If you need a refill on your cardiac medications before your next appointment, please call your pharmacy*  Lab Work: Your physician recommends that you return for lab work in: at your next appt- LIPID, LIPOPROTEIN A, LFT If you have labs (blood work) drawn today and your tests are completely normal, you will receive your results only by: MyChart Message (if you have MyChart) OR . A paper copy in the mail If you have any lab test that is abnormal or we need to change your treatment, we will call you to review the results.  Testing/Procedures: A chest x-ray takes a picture of the organs and structures inside the chest, including the heart, lungs, and blood vessels. This test can show several things, including, whether the heart is enlarges; whether fluid is building up in the lungs; and whether pacemaker / defibrillator leads are still in place. COMPLETE WHERE IS CONVENIENT FOR YOU  Follow-Up: At [facility], you and your health needs are our priority.  As part of our continuing mission to provide you with exceptional heart care, we  have created designated Provider Care Teams.  These Care Teams include your primary Cardiologist (physician) and Advanced Practice Providers (APPs -  Physician Assistants and Nurse Practitioners) who all work together to provide you with the care you need, when you need it.  Your next appointment:   2 week(s)  The format for your next appointment:   In Person  Provider:   [facility], MD Jodelle Red, PA-C  Other Instructions     Signed, Azalee Course, Azalee Course  10/22/2019 11:33 PM    [facility]

## 2019-10-22 ENCOUNTER — Encounter: Payer: Self-pay | Admitting: Physician Assistant

## 2019-11-02 ENCOUNTER — Telehealth: Payer: Self-pay

## 2019-11-02 NOTE — Telephone Encounter (Signed)
Virtual Visit Pre-Appointment Phone Call  "(Name), I am calling you today to discuss your upcoming appointment. We are currently trying to limit exposure to the virus that causes COVID-19 by seeing patients at home rather than in the office."  1. "What is the BEST phone number to call the day of the visit?" - include this in appointment notes  2. "Do you have or have access to (through a family member/friend) a smartphone with video capability that we can use for your visit?" a. If yes - list this number in appt notes as "cell" (if different from BEST phone #) and list the appointment type as a VIDEO visit in appointment notes b. If no - list the appointment type as a PHONE visit in appointment notes  3. Confirm consent - "In the setting of the current Covid19 crisis, you are scheduled for a (phone or video) visit with your provider on (date) at (time).  Just as we do with many in-office visits, in order for you to participate in this visit, we must obtain consent.  If you'd like, I can send this to your mychart (if signed up) or email for you to review.  Otherwise, I can obtain your verbal consent now.  All virtual visits are billed to your insurance company just like a normal visit would be.  By agreeing to a virtual visit, we'd like you to understand that the technology does not allow for your provider to perform an examination, and thus may limit your provider's ability to fully assess your condition. If your provider identifies any concerns that need to be evaluated in person, we will make arrangements to do so.  Finally, though the technology is pretty good, we cannot assure that it will always work on either your or our end, and in the setting of a video visit, we may have to convert it to a phone-only visit.  In either situation, we cannot ensure that we have a secure connection.  Are you willing to proceed?" STAFF: Did the patient verbally acknowledge consent to telehealth visit? Document  YES/NO here: YES  4. Advise patient to be prepared - "Two hours prior to your appointment, go ahead and check your blood pressure, pulse, oxygen saturation, and your weight (if you have the equipment to check those) and write them all down. When your visit starts, your provider will ask you for this information. If you have an Apple Watch or Kardia device, please plan to have heart rate information ready on the day of your appointment. Please have a pen and paper handy nearby the day of the visit as well."  5. Give patient instructions for MyChart download to smartphone OR Doximity/Doxy.me as below if video visit (depending on what platform provider is using)  6. Inform patient they will receive a phone call 15 minutes prior to their appointment time (may be from unknown caller ID) so they should be prepared to answer    TELEPHONE CALL NOTE  Randall Mullen has been deemed a candidate for a follow-up tele-health visit to limit community exposure during the Covid-19 pandemic. I spoke with the patient via phone to ensure availability of phone/video source, confirm preferred email & phone number, and discuss instructions and expectations.  I reminded Randall Mullen to be prepared with any vital sign and/or heart rhythm information that could potentially be obtained via home monitoring, at the time of his visit. I reminded Randall Mullen to expect a phone call prior to his visit.  Dorris Fetch, CMA 11/02/2019 10:29 AM   INSTRUCTIONS FOR DOWNLOADING THE MYCHART APP TO SMARTPHONE  - The patient must first make sure to have activated MyChart and know their login information - If Apple, go to [employer] and type in MyChart in the search bar and download the app. If Android, ask patient to go to [employer] and type in [place] in the search bar and download the app. The app is free but as with any other app downloads, their phone may require them to verify saved payment information or  Apple/Android password.  - The patient will need to then log into the app with their MyChart username and password, and select [facility] as their healthcare provider to link the account. When it is time for your visit, go to the MyChart app, find appointments, and click Begin Video Visit. Be sure to Select Allow for your device to access the Microphone and Camera for your visit. You will then be connected, and your provider will be with you shortly.  **If they have any issues connecting, or need assistance please contact MyChart service desk [phone])**  **If using a computer, in order to ensure the best quality for their visit they will need to use either of the following Internet Browsers: [employer] or Google Chrome**  IF USING DOXIMITY or DOXY.ME - The patient will receive a link just prior to their visit by text.     FULL LENGTH CONSENT FOR TELE-HEALTH VISIT   I hereby voluntarily request, consent and authorize [facility] and its employed or contracted physicians, physician assistants, nurse practitioners or other licensed health care professionals (the Practitioner), to provide me with telemedicine health care services (the "Services") as deemed necessary by the treating Practitioner. I acknowledge and consent to receive the Services by the Practitioner via telemedicine. I understand that the telemedicine visit will involve communicating with the Practitioner through live audiovisual communication technology and the disclosure of certain medical information by electronic transmission. I acknowledge that I have been given the opportunity to request an in-person assessment or other available alternative prior to the telemedicine visit and am voluntarily participating in the telemedicine visit.  I understand that I have the right to withhold or withdraw my consent to the use of telemedicine in the course of my care at any time, without affecting my right to future care  or treatment, and that the Practitioner or I may terminate the telemedicine visit at any time. I understand that I have the right to inspect all information obtained and/or recorded in the course of the telemedicine visit and may receive copies of available information for a reasonable fee.  I understand that some of the potential risks of receiving the Services via telemedicine include:  Marland Kitchen Delay or interruption in medical evaluation due to technological equipment failure or disruption; . Information transmitted may not be sufficient (e.g. poor resolution of images) to allow for appropriate medical decision making by the Practitioner; and/or  . In rare instances, security protocols could fail, causing a breach of personal health information.  Furthermore, I acknowledge that it is my responsibility to provide information about my medical history, conditions and care that is complete and accurate to the best of my ability. I acknowledge that Practitioner's advice, recommendations, and/or decision may be based on factors not within their control, such as incomplete or inaccurate data provided by me or distortions of diagnostic images or specimens that may result from electronic transmissions. I understand that the  practice of medicine is not an Chief Strategy Officer and that Practitioner makes no warranties or guarantees regarding treatment outcomes. I acknowledge that I will receive a copy of this consent concurrently upon execution via email to the email address I last provided but may also request a printed copy by calling the office of [place].    I understand that my insurance will be billed for this visit.   I have read or had this consent read to me. . I understand the contents of this consent, which adequately explains the benefits and risks of the Services being provided via telemedicine.  . I have been provided ample opportunity to ask questions regarding this consent and the Services and have had  my questions answered to my satisfaction. . I give my informed consent for the services to be provided through the use of telemedicine in my medical care  By participating in this telemedicine visit I agree to the above.

## 2019-11-03 ENCOUNTER — Encounter: Payer: Self-pay | Admitting: Physician Assistant

## 2019-11-03 ENCOUNTER — Telehealth (INDEPENDENT_AMBULATORY_CARE_PROVIDER_SITE_OTHER): Payer: BC Managed Care – PPO | Admitting: Physician Assistant

## 2019-11-03 VITALS — BP 119/79 | HR 70 | Ht 75.0 in | Wt 270.0 lb

## 2019-11-03 DIAGNOSIS — I2510 Atherosclerotic heart disease of native coronary artery without angina pectoris: Secondary | ICD-10-CM | POA: Diagnosis not present

## 2019-11-03 DIAGNOSIS — R7303 Prediabetes: Secondary | ICD-10-CM

## 2019-11-03 DIAGNOSIS — E785 Hyperlipidemia, unspecified: Secondary | ICD-10-CM

## 2019-11-03 LAB — LIPID PANEL
Chol/HDL Ratio: 4.1 ratio (ref 0.0–5.0)
Cholesterol, Total: 143 mg/dL (ref 100–199)
HDL: 35 mg/dL — ABNORMAL LOW (ref >39)
LDL Chol Calc (NIH): 81 mg/dL (ref 0–99)
Triglycerides: 152 mg/dL — ABNORMAL HIGH (ref 0–149)
VLDL Cholesterol Cal: 27 mg/dL (ref 5–40)

## 2019-11-03 LAB — HEPATIC FUNCTION PANEL
ALT: 38 IU/L (ref 0–44)
AST: 23 IU/L (ref 0–40)
Albumin: 4.5 g/dL (ref 4.0–5.0)
Alkaline Phosphatase: 60 IU/L (ref 39–117)
Bilirubin Total: 0.5 mg/dL (ref 0.0–1.2)
Bilirubin, Direct: 0.15 mg/dL (ref 0.00–0.40)
Total Protein: 6.6 g/dL (ref 6.0–8.5)

## 2019-11-03 LAB — LIPOPROTEIN A (LPA): Lipoprotein (a): 11.2 nmol/L (ref <75.0)

## 2019-11-03 MED ORDER — ATORVASTATIN CALCIUM 80 MG PO TABS: 80.0000 mg | ORAL_TABLET | Freq: Every day | ORAL | 3 refills | Status: DC

## 2019-11-03 NOTE — Progress Notes (Signed)
Virtual Visit via Telephone Note   This visit type was conducted due to national recommendations for restrictions regarding the COVID-19 Pandemic (e.g. social distancing) in an effort to limit this patient's exposure and mitigate transmission in our community.  Due to his co-morbid illnesses, this patient is at least at moderate risk for complications without adequate follow up.  This format is felt to be most appropriate for this patient at this time.  The patient did not have access to video technology/had technical difficulties with video requiring transitioning to audio format only (telephone).  All issues noted in this document were discussed and addressed.  No physical exam could be performed with this format.  Please refer to the patient's chart for his  consent to telehealth for [facility].   Date:  11/05/2019   ID:  Randall Mullen, DOB 23-Oct-1970, MRN [id number]  Patient Location: Home Provider Location: Home  PCP:  Guadalupe Maple, MD  Cardiologist:  Buford Dresser, MD  Electrophysiologist:  None   Evaluation Performed:  Follow-Up Visit  Chief Complaint:  followup  History of Present Illness:    Randall Mullen is a 49 y.o. male with hyperlipidemia, gout, GERD, prediabetes and CAD.  He has family history of early CAD with his father having 6 cardiac catheterizations in the past.  He presented to [facility] in October 2020 with chest pain.  Myoview demonstrated moderate inferolateral reversible changes consistent with ischemia, EF 37%.  He was subsequently transferred to [facility] for further evaluation.  Echocardiogram obtained on 06/29/2019 showed EF 55 to 60%, hypokinesis in the basal inferolateral and basal inferior segment, moderate LAE.  Cardiac catheterization performed on 06/29/2019 revealed 80% proximal RCA lesion, 99% RPDA  lesion, 50% ostial left circumflex lesion, 99% proximal LAD lesion, 100% mid to distal left circumflex occlusion.  He  returned to the Cath Lab on 06/29/2019 for staged intervention and underwent DES to RPAV, LAD and RCA.  Prior to the procedure, he was loaded on Brilinta.  Postprocedure, he was instructed to continue aspirin and Brilinta for a minimum of 1 year.  Hemoglobin A1c obtained during the admission was 6.2 which placed the patient in the prediabetes range.  Unfortunately both his wife and his son tested positive for Covid on 07/29/2019.  He was tested negative for Covid on 11/17 however he had Covid-like symptoms including fever, cough, and loss of taste.  He went back to the ED on 08/15/2019 with what appears to be a panic attack.  Chest x-ray showed bilateral infiltrate, O2 saturation was 92% on room air.  His temperature was 102.  He was started on Rocephin and azithromycin in the ED.  PCR and point-of-care Covid test came back negative again on 11/30.  Respiratory virus panel was also negative.  D-dimer was positive.  He was discharged on 7 days of Augmentin and dexamethasone.  I last saw the patient on 10/20/2019 at which time he complained of occasional chest discomfort.  I added low-dose metoprolol 12.5 mg twice daily to his medical regimen.  Repeat lipid panel showed mildly elevated LDL of 81, low HDL 35.  Borderline triglyceride 152.  Lipoprotein a was normal.  Repeat chest x-ray showed no acute pulmonary disease.  Since addition of low-dose beta-blocker, he has been doing very well and denies any recent chest discomfort or shortness of breath.  He has no lower extremity edema, orthopnea or PND.  I recommended continue on the current therapy.   The patient does not have symptoms concerning  for COVID-19 infection (fever, chills, cough, or new shortness of breath).    Past Medical History:  Diagnosis Date  . GERD (gastroesophageal reflux disease)   . Gout   . HLD (hyperlipidemia)    Past Surgical History:  Procedure Laterality Date  . CORONARY STENT INTERVENTION N/A 06/30/2019   Procedure: CORONARY  STENT INTERVENTION;  Surgeon: Kathleene Hazel, MD;  Location: [facility] INVASIVE CV LAB;  Service: Cardiovascular;  Laterality: N/A;  . LEFT HEART CATH AND CORONARY ANGIOGRAPHY N/A 06/29/2019   Procedure: LEFT HEART CATH AND CORONARY ANGIOGRAPHY;  Surgeon: Kathleene Hazel, MD;  Location: [facility] INVASIVE CV LAB;  Service: Cardiovascular;  Laterality: N/A;  . VENTRAL HERNIA REPAIR       Current Meds  Medication Sig  . aspirin EC 81 MG tablet Take 81 mg by mouth daily.  Marland Kitchen atorvastatin (LIPITOR) 80 MG tablet Take 1 tablet (80 mg total) by mouth daily at 6 PM.  . indomethacin (INDOCIN) 25 MG capsule Take 25 mg by mouth daily as needed. Gout flare-up  . metoprolol tartrate (LOPRESSOR) 25 MG tablet Take 0.5 tablets (12.5 mg total) by mouth 2 (two) times daily.  Marland Kitchen omeprazole (PRILOSEC) 40 MG capsule Take 40 mg by mouth daily as needed (reflux/heartburn).   Marland Kitchen TADALAFIL PO Take 9-18 mg by mouth as needed (ED). (chewable tablets)  . ticagrelor (BRILINTA) 90 MG TABS tablet Take 1 tablet (90 mg total) by mouth 2 (two) times daily.  . Vitamin D, Ergocalciferol, (DRISDOL) 1.25 MG (50000 UT) CAPS capsule Take 50,000 Units by mouth every Sunday.   . [DISCONTINUED] atorvastatin (LIPITOR) 40 MG tablet Take 40 mg by mouth daily at 6 PM.     Allergies:   Patient has no known allergies.   Social History   Tobacco Use  . Smoking status: Never Smoker  . Smokeless tobacco: Never Used  Substance Use Topics  . Alcohol use: Yes  . Drug use: Never     Family Hx: The patient's family history includes CAD in his father; Heart attack in his paternal grandmother.  ROS:   Please see the history of present illness.     All other systems reviewed and are negative.   Prior CV studies:   The following studies were reviewed today:  Cath 06/29/2019  Prox RCA lesion is 80% stenosed.  RPAV lesion is 99% stenosed.  Ost Cx to Prox Cx lesion is 50% stenosed.  Prox LAD lesion is 99% stenosed.  Mid Cx to  Dist Cx lesion is 100% stenosed.  1. Severe three vessel CAD 2. Severe stenosis proximal LAD. The mid and distal LAD fills briskly from antegrade flow but also seen to fill from faint right to left collaterals.  3. Moderate disease in the proximal Circumflex artery with chronic occlusion of the mid Circumflex just after the takeoff of the large obtuse marginal branch 4. Severe focal stenosis in the proximal segment of the large dominant RCA. Severe stenosis in the the proximal segment of the moderate caliber posterolateral artery 5. Normal filling pressures.   Recommendations: Options for treatment of multi-vessel CAD include CABG vs multi-vessel stenting. Given his young age and focal nature of the disease in his RCA and LAD, I feel that stenting is the best long term option for treatment. I have reviewed with my interventional colleagues this am and they agree. I have discussed this with the patient and his wife and they agree. Will load with Brilinta 180 mg po x 1 today. Plan PCI of  the RCA and LAD tomorrow.    Cath 06/30/2019  Prox LAD lesion is 99% stenosed.  Ost Cx to Prox Cx lesion is 50% stenosed.  Mid Cx to Dist Cx lesion is 100% stenosed.  Prox RCA lesion is 80% stenosed.  RPAV lesion is 99% stenosed.  A drug-eluting stent was successfully placed using a STENT RESOLUTE ONYX 3.5X26.  Post intervention, there is a 0% residual stenosis.  A drug-eluting stent was successfully placed using a STENT RESOLUTE ONYX 2.25X30.  Post intervention, there is a 0% residual stenosis.  A drug-eluting stent was successfully placed using a STENT RESOLUTE ONYX 4.0X18.  Post intervention, there is a 0% residual stenosis.  1. Successful PTCA/DES x 1 proximal LAD 2. Successful PTCA/DES x 1 posterolateral artery 3. Successful PTCA/DES x 1 proximal RCA  Will continue DAPT with ASA and Brilinta for one year. Continue statin and beta blocker. Likely d/c home tomorrow after hydration post  dye load. BMET in am.   Labs/Other Tests and Data Reviewed:    EKG:  An ECG dated 10/20/2019 was personally reviewed today and demonstrated:  NSR with TWI in anterior leads  Recent Labs: 08/15/2019: B Natriuretic Peptide 27.5 08/17/2019: BUN 19; Creatinine, Ser 1.09; Hemoglobin 13.9; Magnesium 2.6; Platelets 367; Potassium 4.3; Sodium 139 11/02/2019: ALT 38   Recent Lipid Panel Lab Results  Component Value Date/Time   CHOL 143 11/02/2019 11:53 AM   TRIG 152 (H) 11/02/2019 11:53 AM   HDL 35 (L) 11/02/2019 11:53 AM   CHOLHDL 4.1 11/02/2019 11:53 AM   LDLCALC 81 11/02/2019 11:53 AM    Wt Readings from Last 3 Encounters:  11/03/19 270 lb (122.5 kg)  10/20/19 296 lb (134.3 kg)  08/25/19 278 lb (126.1 kg)     Objective:    Vital Signs:  BP 119/79   Pulse 70   Ht 6\' 3"  (1.905 m)   Wt 270 lb (122.5 kg)   BMI 33.75 kg/m    VITAL SIGNS:  reviewed  ASSESSMENT & PLAN:    1. CAD: Since addition of low-dose metoprolol during the last office visit, his chest pain has completely resolved.  Continue aspirin and Brilinta.  Most recent PCI was in October 2020  2. Hyperlipidemia: Repeat lipid panel yesterday continue to show mildly elevated LDL.  Increase Lipitor to 80 mg daily.  A repeat fasting lipid panel and LFT in 2 to 3 months.  3. Prediabetes: Managed by primary care provider  COVID-19 Education: The signs and symptoms of COVID-19 were discussed with the patient and how to seek care for testing (follow up with PCP or arrange E-visit). The importance of social distancing was discussed today.  Time:   Today, I have spent 6 minutes with the patient with telehealth technology discussing the above problems.     Medication Adjustments/Labs and Tests Ordered: Current medicines are reviewed at length with the patient today.  Concerns regarding medicines are outlined above.   Tests Ordered: Orders Placed This Encounter  Procedures  . Hepatic function panel  . Lipid panel     Medication Changes: Meds ordered this encounter  Medications  . atorvastatin (LIPITOR) 80 MG tablet    Sig: Take 1 tablet (80 mg total) by mouth daily at 6 PM.    Dispense:  90 tablet    Refill:  3    Follow Up:  Either In Person or Virtual in 4 month(s)  Signed, November 2020, Azalee Course  11/05/2019 11:17 PM    [facility]

## 2019-11-03 NOTE — Patient Instructions (Addendum)
Medication Instructions:   Increase Lipitor to 80 mg daily  *If you need a refill on your cardiac medications before your next appointment, please call your pharmacy*  Lab Work: Your physician recommends that you return for lab work in April prior to your follow up appointment with Dr. Cristal Deer in May.  Fasting Lipid Panel-DO NOT EAT OR DRINK PAST MIDNIGHT. (OK TO DRINK WATER)  Hepatic (Liver) Function Test  If you have labs (blood work) drawn today and your tests are completely normal, you will receive your results only by: Marland Kitchen MyChart Message (if you have MyChart) OR . A paper copy in the mail If you have any lab test that is abnormal or we need to change your treatment, we will call you to review the results.  Testing/Procedures: NONE ordered at this time of appointment   Follow-Up: At [facility], you and your health needs are our priority.  As part of our continuing mission to provide you with exceptional heart care, we have created designated Provider Care Teams.  These Care Teams include your primary Cardiologist (physician) and Advanced Practice Providers (APPs -  Physician Assistants and Nurse Practitioners) who all work together to provide you with the care you need, when you need it.  Your next appointment:   3 month(s) May 2021  The format for your next appointment:   In Person  Provider:   Jodelle Red, MD  Other Instructions

## 2019-11-07 ENCOUNTER — Telehealth: Payer: Self-pay

## 2019-11-07 NOTE — Telephone Encounter (Signed)
Tried calling the patient on his mobile number and could not leave a voice message for the patient for his voicemail box is full. Will try calling the patient again.   Message from [facility], [place]: Normal liver function, normal lipoprotein A. Total cholesterol good, triglyceride and LDL level still borderline high. Given significant coronary artery disease. Increased the Lipitor to 80 mg during recent virtual visit. Let's see if the higher dose of Lipitor can control his cholesterol.

## 2020-01-02 MED ORDER — METOPROLOL TARTRATE 25 MG PO TABS: 12.5000 mg | ORAL_TABLET | Freq: Two times a day (BID) | ORAL | 3 refills | Status: DC

## 2020-01-05 MED ORDER — METOPROLOL TARTRATE 25 MG PO TABS: 12.5000 mg | ORAL_TABLET | Freq: Two times a day (BID) | ORAL | 3 refills | Status: DC

## 2020-01-05 NOTE — Addendum Note (Signed)
Addended by: Julio Sicks on: 01/05/2020 08:16 AM   Modules accepted: Orders

## 2020-01-17 ENCOUNTER — Other Ambulatory Visit: Payer: Self-pay

## 2020-01-17 DIAGNOSIS — E785 Hyperlipidemia, unspecified: Secondary | ICD-10-CM

## 2020-01-17 DIAGNOSIS — N179 Acute kidney failure, unspecified: Secondary | ICD-10-CM

## 2020-01-17 LAB — LIPID PANEL
Chol/HDL Ratio: 3.8 ratio (ref 0.0–5.0)
Cholesterol, Total: 90 mg/dL — ABNORMAL LOW (ref 100–199)
HDL: 24 mg/dL — ABNORMAL LOW (ref >39)
LDL Chol Calc (NIH): 46 mg/dL (ref 0–99)
Triglycerides: 103 mg/dL (ref 0–149)
VLDL Cholesterol Cal: 20 mg/dL (ref 5–40)

## 2020-01-17 LAB — HEPATIC FUNCTION PANEL
ALT: 60 IU/L — ABNORMAL HIGH (ref 0–44)
AST: 36 IU/L (ref 0–40)
Albumin: 4.4 g/dL (ref 4.0–5.0)
Alkaline Phosphatase: 57 IU/L (ref 39–117)
Bilirubin Total: 0.3 mg/dL (ref 0.0–1.2)
Bilirubin, Direct: 0.07 mg/dL (ref 0.00–0.40)
Total Protein: 6.6 g/dL (ref 6.0–8.5)

## 2020-01-18 NOTE — Progress Notes (Signed)
Liver function ok with exception of borderline elevated ALT, cholesterol well controlled. Will repeat Liver function test in 3 month to make sure ALT does not continue to rise

## 2020-01-19 ENCOUNTER — Encounter: Payer: Self-pay | Admitting: Cardiology

## 2020-01-19 ENCOUNTER — Other Ambulatory Visit: Payer: Self-pay

## 2020-01-19 ENCOUNTER — Telehealth: Payer: Self-pay

## 2020-01-19 ENCOUNTER — Ambulatory Visit: Payer: BC Managed Care – PPO | Admitting: Cardiology

## 2020-01-19 VITALS — BP 112/80 | HR 58 | Ht 75.0 in | Wt 293.8 lb

## 2020-01-19 DIAGNOSIS — Z79899 Other long term (current) drug therapy: Secondary | ICD-10-CM

## 2020-01-19 DIAGNOSIS — Z955 Presence of coronary angioplasty implant and graft: Secondary | ICD-10-CM | POA: Diagnosis not present

## 2020-01-19 DIAGNOSIS — E782 Mixed hyperlipidemia: Secondary | ICD-10-CM

## 2020-01-19 DIAGNOSIS — Z8249 Family history of ischemic heart disease and other diseases of the circulatory system: Secondary | ICD-10-CM | POA: Insufficient documentation

## 2020-01-19 DIAGNOSIS — I2510 Atherosclerotic heart disease of native coronary artery without angina pectoris: Secondary | ICD-10-CM

## 2020-01-19 DIAGNOSIS — Z7189 Other specified counseling: Secondary | ICD-10-CM

## 2020-01-19 NOTE — Telephone Encounter (Addendum)
Left a voice message for the patient to give our office a call to discuss his recent lab results.  ----- Message from [place], [place] sent at 01/18/2020  8:21 AM EDT ----- Liver function ok with exception of borderline elevated ALT, cholesterol well controlled. Will repeat Liver function test in 3 month to make sure ALT does not continue to rise

## 2020-01-19 NOTE — Patient Instructions (Signed)
Medication Instructions:  Your Physician recommend you continue on your current medication as directed.    *If you need a refill on your cardiac medications before your next appointment, please call your pharmacy*   Lab Work: Your physician recommends that you return for lab work in 3 months ( LFT).   If you have labs (blood work) drawn today and your tests are completely normal, you will receive your results only by: Marland Kitchen MyChart Message (if you have MyChart) OR . A paper copy in the mail If you have any lab test that is abnormal or we need to change your treatment, we will call you to review the results.   Testing/Procedures: None   Follow-Up: At [facility], you and your health needs are our priority.  As part of our continuing mission to provide you with exceptional heart care, we have created designated Provider Care Teams.  These Care Teams include your primary Cardiologist (physician) and Advanced Practice Providers (APPs -  Physician Assistants and Nurse Practitioners) who all work together to provide you with the care you need, when you need it.  We recommend signing up for the patient portal called "MyChart".  Sign up information is provided on this After Visit Summary.  MyChart is used to connect with patients for Virtual Visits (Telemedicine).  Patients are able to view lab/test results, encounter notes, upcoming appointments, etc.  Non-urgent messages can be sent to your provider as well.   To learn more about what you can do with MyChart, go to [email or web address].    Your next appointment:   5 month(s)  The format for your next appointment:   In Person  Provider:   Jodelle Red, MD

## 2020-01-19 NOTE — Progress Notes (Signed)
Cardiology Office Note:    Date:  01/19/2020   ID:  Randall Mullen, DOB 02-19-1971, MRN [id number]  PCP:  Guadalupe Maple, MD  Cardiologist:  Buford Dresser, MD  Referring MD: Guadalupe Maple, MD   CC: follow up  History of Present Illness:    Randall Mullen is a 49 y.o. male with a hx of hyperlipidemia, strong family history of CAD, recent multivessel CAD s/p stents who is seen for follow up.  Reviewed televisit note from [place] from 11/03/19. Was tolerating low dose beta blocker at that time with no additional symptoms. Atorvastatin increased to 80 mg daily.  Today: Reviewed recent lipids, excellent control with LDL 46 and TG 103. ALT mildly elevated at 60. Planned for repeat LFTs in 3 mos. Notes that when he was bodybuilding/doing heavy workouts in 2012-2013, he had elevated liver enzymes in the past (I cannot see these) but they resolved with stopping supplements. He is not currently on supplements at this time.  Tolerating medications well. No issues. Has not required any nitroglycerin.  Denies shortness of breath at rest or with normal exertion. No PND, orthopnea, LE edema or unexpected weight gain. No syncope or palpitations. Rare chest heaviness with lying down, better if he gets up and moves.   Having nerve pain, no clear triggers. Can be so bad that it makes him stop. Very sharp, rapid. Different than his heart pain. Can be in calf, arms, hands. Concerned that there are two family members with cerebellar degeneration. He has a history of carpal tunnel bilaterally as well, though this has improved since his stent placement. Has had cortisone shots in his left shoulder before as well.  Working out every day, both treadmill and weights. No issues, feels well. Had a diet lapse but back to the plan now. Avoids fried foods, carbs, salt.  Past Medical History:  Diagnosis Date  . GERD (gastroesophageal reflux disease)   . Gout   . HLD (hyperlipidemia)     Past  Surgical History:  Procedure Laterality Date  . CORONARY STENT INTERVENTION N/A 06/30/2019   Procedure: CORONARY STENT INTERVENTION;  Surgeon: Burnell Blanks, MD;  Location: [place] [facility];  Service: Cardiovascular;  Laterality: N/A;  . LEFT HEART CATH AND CORONARY ANGIOGRAPHY N/A 06/29/2019   Procedure: LEFT HEART CATH AND CORONARY ANGIOGRAPHY;  Surgeon: Burnell Blanks, MD;  Location: [place] [facility];  Service: Cardiovascular;  Laterality: N/A;  . VENTRAL HERNIA REPAIR      Current Medications: Current Outpatient Medications on File Prior to Visit  Medication Sig  . aspirin EC 81 MG tablet Take 81 mg by mouth daily.  Marland Kitchen atorvastatin (LIPITOR) 80 MG tablet Take 1 tablet (80 mg total) by mouth daily at 6 PM.  . indomethacin (INDOCIN) 25 MG capsule Take 25 mg by mouth daily as needed. Gout flare-up  . metoprolol tartrate (LOPRESSOR) 25 MG tablet Take 0.5 tablets (12.5 mg total) by mouth 2 (two) times daily.  Marland Kitchen omeprazole (PRILOSEC) 40 MG capsule Take 40 mg by mouth daily as needed (reflux/heartburn).   Marland Kitchen TADALAFIL PO Take 9-18 mg by mouth as needed (ED). (chewable tablets)  . ticagrelor (BRILINTA) 90 MG TABS tablet Take 1 tablet (90 mg total) by mouth 2 (two) times daily.  . Vitamin D, Ergocalciferol, (DRISDOL) 1.25 MG (50000 UT) CAPS capsule Take 50,000 Units by mouth every Sunday.   . nitroGLYCERIN (NITROSTAT) 0.4 MG SL tablet Place 1 tablet (0.4 mg total) under the tongue every 5 (five) minutes  as needed for up to 15 days for chest pain.   No current facility-administered medications on file prior to visit.     Allergies:   Patient has no known allergies.   Social History   Tobacco Use  . Smoking status: Never Smoker  . Smokeless tobacco: Never Used  Substance Use Topics  . Alcohol use: Yes  . Drug use: Never    Family History: family history includes CAD in his father; Heart attack in his paternal grandmother.  ROS:   Please see the history of  present illness.  Additional pertinent ROS otherwise unremarkable.  EKGs/Labs/Other Studies Reviewed:    The following studies were reviewed today: PCI 03-Jul-2019  Prox LAD lesion is 99% stenosed.  Ost Cx to Prox Cx lesion is 50% stenosed.  Mid Cx to Dist Cx lesion is 100% stenosed.  Prox RCA lesion is 80% stenosed.  RPAV lesion is 99% stenosed.  A drug-eluting stent was successfully placed using a STENT RESOLUTE ONYX 3.5X26.  Post intervention, there is a 0% residual stenosis.  A drug-eluting stent was successfully placed using a STENT RESOLUTE ONYX 2.25X30.  Post intervention, there is a 0% residual stenosis.  A drug-eluting stent was successfully placed using a STENT RESOLUTE ONYX 4.0X18.  Post intervention, there is a 0% residual stenosis.  1. Successful PTCA/DES x 1 proximal LAD 2. Successful PTCA/DES x 1 posterolateral artery 3. Successful PTCA/DES x 1 proximal RCA  Will continue DAPT with ASA and Brilinta for one year. Continue statin and beta blocker. Likely d/c home tomorrow after hydration post dye load. BMET in am.   Echo 06/29/19 1. Left ventricular ejection fraction, by visual estimation, is 55 to 60%. The left ventricle has normal function. Normal left ventricular size. There is mildly increased left ventricular hypertrophy. 2. Impaired diastolic annular velocities for age, early diastolic dysfunction. 3. Basal inferolateral segment and basal inferior segment are hypokinetic. 4. Global right ventricle has normal systolic function.The right ventricular size is normal. No increase in right ventricular wall thickness. 5. Left atrial size was moderately dilated. 6. Right atrial size was mildly dilated. 7. The mitral valve is normal in structure. No evidence of mitral valve regurgitation. No evidence of mitral stenosis. 8. The tricuspid valve is normal in structure. Tricuspid valve regurgitation was not visualized by color flow Doppler. 9. The aortic  valve is normal in structure. Aortic valve regurgitation was not visualized by color flow Doppler. Structurally normal aortic valve, with no evidence of sclerosis or stenosis. 10. The pulmonic valve was normal in structure. Pulmonic valve regurgitation is not visualized by color flow Doppler. 11. Normal pulmonary artery systolic pressure. 12. The inferior vena cava is normal in size with greater than 50% respiratory variability, suggesting right atrial pressure of 3 mmHg.  Cath 06/29/19  Prox RCA lesion is 80% stenosed.  RPAV lesion is 99% stenosed.  Ost Cx to Prox Cx lesion is 50% stenosed.  Prox LAD lesion is 99% stenosed.  Mid Cx to Dist Cx lesion is 100% stenosed.  1. Severe three vessel CAD 2. Severe stenosis proximal LAD. The mid and distal LAD fills briskly from antegrade flow but also seen to fill from faint right to left collaterals.  3. Moderate disease in the proximal Circumflex artery with chronic occlusion of the mid Circumflex just after the takeoff of the large obtuse marginal branch 4. Severe focal stenosis in the proximal segment of the large dominant RCA. Severe stenosis in the the proximal segment of the moderate caliber posterolateral  artery 5. Normal filling pressures.   Recommendations: Options for treatment of multi-vessel CAD include CABG vs multi-vessel stenting. Given his young age and focal nature of the disease in his RCA and LAD, I feel that stenting is the best long term option for treatment. I have reviewed with my interventional colleagues this am and they agree. I have discussed this with the patient and his wife and they agree. Will load with Brilinta 180 mg po x 1 today. Plan PCI of the RCA and LAD tomorrow.   Lexiscan 06/28/19 ([place]) Moderate area of moderately decreased activity in inferolateral wall near base, reversible. Fixed small defect in anteroseptal region. EF 37%  EKG:  EKG is personally reviewed.  The ekg ordered 10/20/19 demonstrates  NSR with anterolateral TWI  Recent Labs: 08/15/2019: B Natriuretic Peptide 27.5 08/17/2019: BUN 19; Creatinine, Ser 1.09; Hemoglobin 13.9; Magnesium 2.6; Platelets 367; Potassium 4.3; Sodium 139 01/17/2020: ALT 60  Recent Lipid Panel    Component Value Date/Time   CHOL 90 (L) 01/17/2020 0927   TRIG 103 01/17/2020 0927   HDL 24 (L) 01/17/2020 0927   CHOLHDL 3.8 01/17/2020 0927   LDLCALC 46 01/17/2020 0927     Physical Exam:    VS:  BP 112/80   Pulse (!) 58   Ht 6\' 3"  (1.905 m)   Wt 293 lb 12.8 oz (133.3 kg)   SpO2 97%   BMI 36.72 kg/m     Wt Readings from Last 3 Encounters:  01/19/20 293 lb 12.8 oz (133.3 kg)  11/03/19 270 lb (122.5 kg)  10/20/19 296 lb (134.3 kg)    GEN: Well nourished, well developed in no acute distress HEENT: Normal, moist mucous membranes NECK: No JVD CARDIAC: regular rhythm, normal S1 and S2, no rubs or gallops. No murmur. VASCULAR: Radial and DP pulses 2+ bilaterally. No carotid bruits RESPIRATORY:  Clear to auscultation without rales, wheezing or rhonchi  ABDOMEN: Soft, non-tender, non-distended MUSCULOSKELETAL:  Ambulates independently SKIN: Warm and dry, no edema NEUROLOGIC:  Alert and oriented x 3. No focal neuro deficits noted. PSYCHIATRIC:  Normal affect   ASSESSMENT:    1. Coronary artery disease involving native coronary artery of native heart without angina pectoris   2. Medication management   3. History of coronary angioplasty with insertion of stent   4. Mixed hyperlipidemia   5. Family history of heart disease   6. Cardiac risk counseling   7. Counseling on health promotion and disease prevention    PLAN:    3V CAD (found 2/2 unstable angina) s/p PCI 10/15. -continue DAPT with aspirin and brilinta for 12 months. At follow up, discuss dropping back to aspirin alone vs. Lower dose of ticagrelor or switch to clopidogrel.  -continue atorvastatin 80 mg daily -counseled on diet, exercise -discussed PDE5 inhibitors and risk with SL  NG. Ok to use PDEi but need to alert any EMS/ER to use if he has chest pain to alert them to life threatening interaction with nitrate. -normal EF -tolerating metoprolol 12.5 mg BID.  Mixed hyperlipidemia -well controlled on recent check -normal lp(a) -continue atorvastatin 80 mg daily  Additional CV risk: Family history: father with 6 caths, multiple family members with MI in late 40s/50s Obesity: BMI 36, but very athletic Impaired glucose tolerance: A1c 6.2 No tobacco use No history of HTN  Cardiac risk counseling and prevention recommendations: -recommend heart healthy/Mediterranean diet, with whole grains, fruits, vegetable, fish, lean meats, nuts, and olive oil. Limit salt. -recommend moderate walking, 3-5 times/week for  30-50 minutes each session. Aim for at least 150 minutes.week. Goal should be pace of 3 miles/hours, or walking 1.5 miles in 30 minutes -recommend avoidance of tobacco products. Avoid excess alcohol.  Plan for follow up: LFTs in 3 mos given mild elevation, appt in 6 mos  Medication Adjustments/Labs and Tests Ordered: Current medicines are reviewed at length with the patient today.  Concerns regarding medicines are outlined above.  Orders Placed This Encounter  Procedures  . Hepatic function panel   No orders of the defined types were placed in this encounter.   Patient Instructions  Medication Instructions:  Your Physician recommend you continue on your current medication as directed.    *If you need a refill on your cardiac medications before your next appointment, please call your pharmacy*   Lab Work: Your physician recommends that you return for lab work in 3 months ( LFT).   If you have labs (blood work) drawn today and your tests are completely normal, you will receive your results only by: Marland Kitchen MyChart Message (if you have MyChart) OR . A paper copy in the mail If you have any lab test that is abnormal or we need to change your treatment, we  will call you to review the results.   Testing/Procedures: None   Follow-Up: At [facility], you and your health needs are our priority.  As part of our continuing mission to provide you with exceptional heart care, we have created designated Provider Care Teams.  These Care Teams include your primary Cardiologist (physician) and Advanced Practice Providers (APPs -  Physician Assistants and Nurse Practitioners) who all work together to provide you with the care you need, when you need it.  We recommend signing up for the patient portal called "MyChart".  Sign up information is provided on this After Visit Summary.  MyChart is used to connect with patients for Virtual Visits (Telemedicine).  Patients are able to view lab/test results, encounter notes, upcoming appointments, etc.  Non-urgent messages can be sent to your provider as well.   To learn more about what you can do with MyChart, go to [email or web address].    Your next appointment:   5 month(s)  The format for your next appointment:   In Person  Provider:   Jodelle Red, MD      Signed, Jodelle Red, MD PhD 01/19/2020 12:55 PM    [facility]

## 2020-05-16 LAB — BASIC METABOLIC PANEL
BUN/Creatinine Ratio: 12 (ref 9–20)
BUN: 16 mg/dL (ref 6–24)
CO2: 23 mmol/L (ref 20–29)
Calcium: 9.6 mg/dL (ref 8.7–10.2)
Chloride: 102 mmol/L (ref 96–106)
Creatinine, Ser: 1.32 mg/dL — ABNORMAL HIGH (ref 0.76–1.27)
GFR calc Af Amer: 74 mL/min/{1.73_m2} (ref >59)
GFR calc non Af Amer: 64 mL/min/{1.73_m2} (ref >59)
Glucose: 99 mg/dL (ref 65–99)
Potassium: 4.3 mmol/L (ref 3.5–5.2)
Sodium: 140 mmol/L (ref 134–144)

## 2020-05-16 LAB — HEPATIC FUNCTION PANEL
ALT: 35 IU/L (ref 0–44)
AST: 23 IU/L (ref 0–40)
Albumin: 4.5 g/dL (ref 4.0–5.0)
Alkaline Phosphatase: 54 IU/L (ref 48–121)
Bilirubin Total: 0.3 mg/dL (ref 0.0–1.2)
Bilirubin, Direct: 0.1 mg/dL (ref 0.00–0.40)
Total Protein: 6.9 g/dL (ref 6.0–8.5)

## 2020-05-16 LAB — LIPID PANEL
Chol/HDL Ratio: 3.3 ratio (ref 0.0–5.0)
Cholesterol, Total: 123 mg/dL (ref 100–199)
HDL: 37 mg/dL — ABNORMAL LOW (ref >39)
LDL Chol Calc (NIH): 62 mg/dL (ref 0–99)
Triglycerides: 139 mg/dL (ref 0–149)
VLDL Cholesterol Cal: 24 mg/dL (ref 5–40)

## 2020-05-18 ENCOUNTER — Encounter: Payer: Self-pay | Admitting: Cardiology

## 2020-05-18 ENCOUNTER — Other Ambulatory Visit: Payer: Self-pay

## 2020-05-18 ENCOUNTER — Ambulatory Visit: Payer: BC Managed Care – PPO | Admitting: Cardiology

## 2020-05-18 VITALS — BP 128/82 | HR 62 | Temp 97.2°F | Ht 75.0 in | Wt 285.2 lb

## 2020-05-18 DIAGNOSIS — Z7189 Other specified counseling: Secondary | ICD-10-CM

## 2020-05-18 DIAGNOSIS — E782 Mixed hyperlipidemia: Secondary | ICD-10-CM

## 2020-05-18 DIAGNOSIS — I2510 Atherosclerotic heart disease of native coronary artery without angina pectoris: Secondary | ICD-10-CM | POA: Diagnosis not present

## 2020-05-18 DIAGNOSIS — Z955 Presence of coronary angioplasty implant and graft: Secondary | ICD-10-CM | POA: Diagnosis not present

## 2020-05-18 DIAGNOSIS — Z8249 Family history of ischemic heart disease and other diseases of the circulatory system: Secondary | ICD-10-CM

## 2020-05-18 MED ORDER — METOPROLOL SUCCINATE ER 25 MG PO TB24: 25.0000 mg | ORAL_TABLET | Freq: Every day | ORAL | 3 refills | Status: DC

## 2020-05-18 NOTE — Patient Instructions (Addendum)
Medication Instructions:  Stop taking brilinta after 06/29/20 Stop taking Metoprolol tartrate 12.5 mg twice a day. Start taking Metoprolol Succinate 25 mg daily  *If you need a refill on your cardiac medications before your next appointment, please call your pharmacy*   Lab Work: None   Testing/Procedures: None   Follow-Up: At [facility], you and your health needs are our priority.  As part of our continuing mission to provide you with exceptional heart care, we have created designated Provider Care Teams.  These Care Teams include your primary Cardiologist (physician) and Advanced Practice Providers (APPs -  Physician Assistants and Nurse Practitioners) who all work together to provide you with the care you need, when you need it.  We recommend signing up for the patient portal called "MyChart".  Sign up information is provided on this After Visit Summary.  MyChart is used to connect with patients for Virtual Visits (Telemedicine).  Patients are able to view lab/test results, encounter notes, upcoming appointments, etc.  Non-urgent messages can be sent to your provider as well.   To learn more about what you can do with MyChart, go to [email or web address].    Your next appointment:   6 month(s)  The format for your next appointment:   In Person  Provider:   Jodelle Red, MD

## 2020-05-18 NOTE — Progress Notes (Signed)
Cardiology Office Note:    Date:  05/18/2020   ID:  Randall Mullen, DOB Oct 05, 1970, MRN [id number]  PCP:  Judge Stall, MD  Cardiologist:  Jodelle Red, MD  Referring MD: Judge Stall, MD   CC: follow up  History of Present Illness:    Randall Mullen is a 49 y.o. 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  Today: Reviewed recent labs, lipids within goal (though LDL 62 from 46), LFTs normalized. Sister and dad had fatty liver. Stressed out with school, large class sizes.  Has not been vaccinated. We discussed the vaccine at length today. He is willing to get the Pfizer vaccine after discussion today.  Working out, no issues. Lifts for 30 mins, cardio for 35-45 minutes/day. Keeps HR at 150 bpm.  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 If he gets fast food, tries to make healthier choices.  Discussed 1 year anniversary of stents. Discussed options for DAPT, after shared decision making will change to aspirin alone after 06/29/20. Discussed metoprolol as well.   Denies chest pain, shortness of breath at rest or with normal exertion. No PND, orthopnea, LE edema or unexpected weight gain. No syncope or palpitations.  Past Medical History:  Diagnosis Date   GERD (gastroesophageal reflux disease)    Gout    HLD (hyperlipidemia)     Past Surgical History:  Procedure Laterality Date   CORONARY STENT INTERVENTION N/A 06/30/2019   Procedure: CORONARY STENT INTERVENTION;  Surgeon: Kathleene Hazel, MD;  Location: [facility];  Service: Cardiovascular;  Laterality: N/A;   LEFT HEART CATH AND CORONARY ANGIOGRAPHY N/A 06/29/2019   Procedure: LEFT HEART CATH AND CORONARY ANGIOGRAPHY;  Surgeon: Kathleene Hazel, MD;  Location: [facility];  Service: Cardiovascular;  Laterality: N/A;   VENTRAL HERNIA REPAIR      Current  Medications: Current Outpatient Medications on File Prior to Visit  Medication Sig   aspirin EC 81 MG tablet Take 81 mg by mouth daily.   atorvastatin (LIPITOR) 80 MG tablet Take 1 tablet (80 mg total) by mouth daily at 6 PM.   indomethacin (INDOCIN) 25 MG capsule Take 25 mg by mouth daily as needed. Gout flare-up   omeprazole (PRILOSEC) 40 MG capsule Take 40 mg by mouth daily as needed (reflux/heartburn).    TADALAFIL PO Take 9-18 mg by mouth as needed (ED). (chewable tablets)   ticagrelor (BRILINTA) 90 MG TABS tablet Take 1 tablet (90 mg total) by mouth 2 (two) times daily.   Vitamin D, Ergocalciferol, (DRISDOL) 1.25 MG (50000 UT) CAPS capsule Take 50,000 Units by mouth every Sunday.    nitroGLYCERIN (NITROSTAT) 0.4 MG SL tablet Place 1 tablet (0.4 mg total) under the tongue every 5 (five) minutes as needed for up to 15 days for chest pain.   No current facility-administered medications on file prior to visit.     Allergies:   Patient has no known allergies.   Social History   Tobacco Use   Smoking status: Never Smoker   Smokeless tobacco: Never Used  Substance Use Topics   Alcohol use: Yes   Drug use: Never    Family History: family history includes CAD in his father; Heart attack in his paternal grandmother.  ROS:   Please see the history of present illness.  Additional pertinent ROS otherwise unremarkable.  EKGs/Labs/Other Studies Reviewed:    The following studies were reviewed  today: PCI 06/30/19  Prox LAD lesion is 99% stenosed.  Ost Cx to Prox Cx lesion is 50% stenosed.  Mid Cx to Dist Cx lesion is 100% stenosed.  Prox RCA lesion is 80% stenosed.  RPAV lesion is 99% stenosed.  A drug-eluting stent was successfully placed using a STENT RESOLUTE ONYX 3.5X26.  Post intervention, there is a 0% residual stenosis.  A drug-eluting stent was successfully placed using a STENT RESOLUTE ONYX 2.25X30.  Post intervention, there is a 0% residual  stenosis.  A drug-eluting stent was successfully placed using a STENT RESOLUTE ONYX 4.0X18.  Post intervention, there is a 0% residual stenosis.  1. Successful PTCA/DES x 1 proximal LAD 2. Successful PTCA/DES x 1 posterolateral artery 3. Successful PTCA/DES x 1 proximal RCA  Will continue DAPT with ASA and Brilinta for one year. Continue statin and beta blocker. Likely d/c home tomorrow after hydration post dye load. BMET in am.   Echo 06/29/19 1. Left ventricular ejection fraction, by visual estimation, is 55 to 60%. The left ventricle has normal function. Normal left ventricular size. There is mildly increased left ventricular hypertrophy. 2. Impaired diastolic annular velocities for age, early diastolic dysfunction. 3. Basal inferolateral segment and basal inferior segment are hypokinetic. 4. Global right ventricle has normal systolic function.The right ventricular size is normal. No increase in right ventricular wall thickness. 5. Left atrial size was moderately dilated. 6. Right atrial size was mildly dilated. 7. The mitral valve is normal in structure. No evidence of mitral valve regurgitation. No evidence of mitral stenosis. 8. The tricuspid valve is normal in structure. Tricuspid valve regurgitation was not visualized by color flow Doppler. 9. The aortic valve is normal in structure. Aortic valve regurgitation was not visualized by color flow Doppler. Structurally normal aortic valve, with no evidence of sclerosis or stenosis. 10. The pulmonic valve was normal in structure. Pulmonic valve regurgitation is not visualized by color flow Doppler. 11. Normal pulmonary artery systolic pressure. 12. The inferior vena cava is normal in size with greater than 50% respiratory variability, suggesting right atrial pressure of 3 mmHg.  Cath 06/29/19  Prox RCA lesion is 80% stenosed.  RPAV lesion is 99% stenosed.  Ost Cx to Prox Cx lesion is 50% stenosed.  Prox LAD lesion is  99% stenosed.  Mid Cx to Dist Cx lesion is 100% stenosed.  1. Severe three vessel CAD 2. Severe stenosis proximal LAD. The mid and distal LAD fills briskly from antegrade flow but also seen to fill from faint right to left collaterals.  3. Moderate disease in the proximal Circumflex artery with chronic occlusion of the mid Circumflex just after the takeoff of the large obtuse marginal branch 4. Severe focal stenosis in the proximal segment of the large dominant RCA. Severe stenosis in the the proximal segment of the moderate caliber posterolateral artery 5. Normal filling pressures.   Recommendations: Options for treatment of multi-vessel CAD include CABG vs multi-vessel stenting. Given his young age and focal nature of the disease in his RCA and LAD, I feel that stenting is the best long term option for treatment. I have reviewed with my interventional colleagues this am and they agree. I have discussed this with the patient and his wife and they agree. Will load with Brilinta 180 mg po x 1 today. Plan PCI of the RCA and LAD tomorrow.   Lexiscan 06/28/19 ([place]) Moderate area of moderately decreased activity in inferolateral wall near base, reversible. Fixed small defect in anteroseptal region. EF 37%  EKG:  EKG is personally reviewed.  The ekg ordered 10/20/19 demonstrates NSR with anterolateral TWI  Recent Labs: 08/15/2019: B Natriuretic Peptide 27.5 08/17/2019: Hemoglobin 13.9; Magnesium 2.6; Platelets 367 05/16/2020: ALT 35; BUN 16; Creatinine, Ser 1.32; Potassium 4.3; Sodium 140  Recent Lipid Panel    Component Value Date/Time   CHOL 123 05/16/2020 0900   TRIG 139 05/16/2020 0900   HDL 37 (L) 05/16/2020 0900   CHOLHDL 3.3 05/16/2020 0900   LDLCALC 62 05/16/2020 0900     Physical Exam:    VS:  BP 128/82    Pulse 62    Temp (!) 97.2 F (36.2 C)    Ht 6\' 3"  (1.905 m)    Wt 285 lb 3.2 oz (129.4 kg)    SpO2 98%    BMI 35.65 kg/m     Wt Readings from Last 3 Encounters:   05/18/20 285 lb 3.2 oz (129.4 kg)  01/19/20 293 lb 12.8 oz (133.3 kg)  11/03/19 270 lb (122.5 kg)    GEN: Well nourished, well developed in no acute distress HEENT: Normal, moist mucous membranes NECK: No JVD CARDIAC: regular rhythm, normal S1 and S2, no rubs or gallops. No murmur. VASCULAR: Radial and DP pulses 2+ bilaterally. No carotid bruits RESPIRATORY:  Clear to auscultation without rales, wheezing or rhonchi  ABDOMEN: Soft, non-tender, non-distended MUSCULOSKELETAL:  Ambulates independently SKIN: Warm and dry, no edema NEUROLOGIC:  Alert and oriented x 3. No focal neuro deficits noted. PSYCHIATRIC:  Normal affect   ASSESSMENT:    1. Coronary artery disease involving native coronary artery of native heart without angina pectoris   2. History of coronary angioplasty with insertion of stent   3. Educated about COVID-19 virus infection   4. Mixed hyperlipidemia   5. Family history of heart disease   6. Counseling on health promotion and disease prevention    PLAN:    Covid education and discussion of vaccine: From my perspective, I am in favor of the Covid vaccine overall. We discussed that both the Moderna and 11/05/19 vaccines that are currently available are mRNA vaccines. The three main ingredients are mRNA, normal saline, and a lipid droplet. I reviewed that mRNA triggers the protein machinery in the cell to make the spike protein. This is then presented to the immune system, which makes many antibodies to different portions of the protein. There is no way the mRNA can get into the nucleus to affect DNA. There is no material from the virus that can cause an actual infection with Covid. The booster (second) shot amplifies that immune response, and after this dose there is 95% efficacy in preventing symptomatic infection with Covid. The multiple antibodies produced means thus far, this method is also protective for the current Covid mutations that have been seen across the world.  The risk of allergic reaction is largely related to the lipid droplet. People without a history of allergic reactions are monitored for 15 minutes, and those with a history of allergic reactions are monitored for 30 minutes. In general, we do not recommend pretreating with NSAIDs, benadryl, etc so as not to affect either the immune response to the vaccine or to mask/delay an allergic reaction. There are certain specific circumstances when this could be recommended, so I always encourage discussion between the patient and their providers to discuss further. For people with weakened immune systems, on immunosuppression, or with a history of life threatening reactions, I recommend they discuss in more detail with their treatment team. I  acknowledged that this is a decision up to the patient, but I offered my availability for any questions or concerns. I expressed that I am not an immune expert but am familiar with the data regarding the vaccines to date.   We discussed his risk at length, and he is amenable to get Pfizer Covid vaccine.  3V CAD (found 2/2 unstable angina) s/p PCI 10/15. -continue DAPT with aspirin and brilinta until 06/29/20. Discussed options for antiplatelet, after shared decision making will drop back to aspirin 81 mg only after 10/15 -continue atorvastatin 80 mg daily -counseled on diet, exercise -discussed PDE5 inhibitors and risk with SL NG. Ok to use PDEi but need to alert any EMS/ER to use if he has chest pain to alert them to life threatening interaction with nitrate. -normal EF -tolerating metoprolol 12.5 mg BID. Will change to metoprolol succinate 25 mg daily for ease of administration. Feels well on this, discussed we might consider stopping in the future if he feels poorly.  Mixed hyperlipidemia -see recent lipids, LDL 62 -normal lp(a) -continue atorvastatin 80 mg daily  Additional CV risk: Family history: father with 6 caths, multiple family members with MI in late  40s/50s Obesity: BMI 36, but very athletic Impaired glucose tolerance: A1c 6.2 No tobacco use No history of HTN  Cardiac risk counseling and prevention recommendations: -recommend heart healthy/Mediterranean diet, with whole grains, fruits, vegetable, fish, lean meats, nuts, and olive oil. Limit salt. -recommend moderate walking, 3-5 times/week for 30-50 minutes each session. Aim for at least 150 minutes.week. Goal should be pace of 3 miles/hours, or walking 1.5 miles in 30 minutes -recommend avoidance of tobacco products. Avoid excess alcohol.  Plan for follow up: 6 mos   Medication Adjustments/Labs and Tests Ordered: Current medicines are reviewed at length with the patient today.  Concerns regarding medicines are outlined above.  No orders of the defined types were placed in this encounter.  Meds ordered this encounter  Medications   metoprolol succinate (TOPROL XL) 25 MG 24 hr tablet    Sig: Take 1 tablet (25 mg total) by mouth daily.    Dispense:  90 tablet    Refill:  3    Patient Instructions  Medication Instructions:  Stop taking brilinta after 06/29/20 Stop taking Metoprolol tartrate 12.5 mg twice a day. Start taking Metoprolol Succinate 25 mg daily  *If you need a refill on your cardiac medications before your next appointment, please call your pharmacy*   Lab Work: None   Testing/Procedures: None   Follow-Up: At [facility], you and your health needs are our priority.  As part of our continuing mission to provide you with exceptional heart care, we have created designated Provider Care Teams.  These Care Teams include your primary Cardiologist (physician) and Advanced Practice Providers (APPs -  Physician Assistants and Nurse Practitioners) who all work together to provide you with the care you need, when you need it.  We recommend signing up for the patient portal called "MyChart".  Sign up information is provided on this After Visit Summary.  MyChart is  used to connect with patients for Virtual Visits (Telemedicine).  Patients are able to view lab/test results, encounter notes, upcoming appointments, etc.  Non-urgent messages can be sent to your provider as well.   To learn more about what you can do with MyChart, go to [email or web address].    Your next appointment:   6 month(s)  The format for your next appointment:   In Person  Provider:  Jodelle Red, MD      Signed, Jodelle Red, MD PhD 05/18/2020 9:07 AM    [facility]

## 2020-08-07 ENCOUNTER — Other Ambulatory Visit: Payer: Self-pay | Admitting: Cardiology

## 2020-11-21 ENCOUNTER — Ambulatory Visit: Payer: BC Managed Care – PPO | Admitting: Cardiology

## 2020-11-22 ENCOUNTER — Telehealth: Payer: Self-pay

## 2020-11-22 NOTE — Telephone Encounter (Signed)
Patient has upcoming appointment with Joni Reining, NP on 11/23/20 at 2:45 PM, will update appointment note to include pre-op clearance

## 2020-11-22 NOTE — Progress Notes (Signed)
Cardiology Office Note   Date:  11/23/2020   ID:  Randall Mullen, DOB [birth year], MRN [id number]  PCP:  Judge Stall, MD  Cardiologist: Dr. Cristal Deer CC: Pre-Operative Cardiac Clearance    History of Present Illness: Randall Mullen is a 50 y.o. male who presents for ongoing assessment and management of hyperlipidemia, CAD with NSTEMI and multivessel stents.  With strong family history of coronary artery disease.  Was last seen by Dr. Cristal Deer on 05/18/2020.  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  On the last office visit the patient was taken off of dual antiplatelet therapy and changed to aspirin 81 mg only, he was to continue atorvastatin 80 mg daily.,  He was continued on metoprolol but changed to metoprolol succinate 25 mg daily.  He was recommended for a heart healthy Mediterranean diet and avoidance of alcohol and tobacco products.  He is here today for follow-up and for preoperative evaluation to have left rotator cuff repair on date to be announced, to [facility] health orthopedics and sports medicine, Dr. Ulice Brilliant.  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  He tore his rotator cuff during one of his workouts.  The patient walks/runs  on the treadmill, does cardio 3-4 times a week as well.  He has not had any discomfort in his chest, excessive dyspnea, change in his energy, or new symptoms other than his left shoulder pain.  He is medically compliant.  Past Medical History:  Diagnosis Date  . GERD (gastroesophageal reflux disease)   . Gout   . HLD (hyperlipidemia)     Past Surgical History:  Procedure Laterality Date  . CORONARY STENT INTERVENTION N/A 06/30/2019   Procedure: CORONARY STENT INTERVENTION;  Surgeon: Kathleene Hazel, MD;  [facility]: [facility];  Service: Cardiovascular;  Laterality: N/A;  . LEFT HEART CATH AND  CORONARY ANGIOGRAPHY N/A 06/29/2019   Procedure: LEFT HEART CATH AND CORONARY ANGIOGRAPHY;  Surgeon: Kathleene Hazel, MD;  Location: [facility];  Service: Cardiovascular;  Laterality: N/A;  . VENTRAL HERNIA REPAIR       Current Outpatient Medications  Medication Sig Dispense Refill  . aspirin EC 81 MG tablet Take 81 mg by mouth daily.    Marland Kitchen atorvastatin (LIPITOR) 80 MG tablet Take 1 tablet (80 mg total) by mouth daily at 6 PM. 90 tablet 3  . BRILINTA 90 MG TABS tablet TAKE 1 TABLET (90 MG TOTAL) BY MOUTH 2 (TWO) TIMES DAILY. 180 tablet 3  . indomethacin (INDOCIN) 25 MG capsule Take 25 mg by mouth daily as needed. Gout flare-up    . metoprolol succinate (TOPROL XL) 25 MG 24 hr tablet Take 1 tablet (25 mg total) by mouth daily. 90 tablet 3  . omeprazole (PRILOSEC) 40 MG capsule Take 40 mg by mouth daily as needed (reflux/heartburn).     Marland Kitchen TADALAFIL PO Take 9-18 mg by mouth as needed (ED). (chewable tablets)    . Vitamin D, Ergocalciferol, (DRISDOL) 1.25 MG (50000 UT) CAPS capsule Take 50,000 Units by mouth every Sunday.     . nitroGLYCERIN (NITROSTAT) 0.4 MG SL tablet Place 1 tablet (0.4 mg total) under the tongue every 5 (five) minutes as needed for up to 15 days for chest pain. 20 tablet 12   No current facility-administered medications for this visit.    Allergies:   Patient has no known allergies.  Social History:  The patient  reports that he has never smoked. He has never used smokeless tobacco. He reports current alcohol use. He reports that he does not use drugs.   Family History:  The patient's family history includes CAD in his father; Heart attack in his paternal grandmother.    ROS: All other systems are reviewed and negative. Unless otherwise mentioned in H&P    PHYSICAL EXAM: VS:  BP 130/78   Pulse 76   Ht 6\' 3"  (1.905 m)   Wt (!) 308 lb (139.7 kg)   SpO2 95%   BMI 38.50 kg/m  , BMI Body mass index is 38.5 kg/m. GEN: Well nourished, well  developed, in no acute distress HEENT: normal Neck: no JVD, carotid bruits, or masses Cardiac: RRR; no murmurs, rubs, or gallops,no edema  Respiratory:  Clear to auscultation bilaterally, normal work of breathing GI: soft, nontender, nondistended, + BS MS: no deformity or atrophy Skin: warm and dry, no rash Neuro:  Strength and sensation are intact Psych: euthymic mood, full affect   EKG: Normal sinus rhythm with sinus arrhythmia, nonspecific ST-T wave abnormality (unchanged since prior EKG) heart rate of 76 bpm personally reviewed.   Recent Labs: 05/16/2020: ALT 35; BUN 16; Creatinine, Ser 1.32; Potassium 4.3; Sodium 140    Lipid Panel    Component Value Date/Time   CHOL 123 05/16/2020 0900   TRIG 139 05/16/2020 0900   HDL 37 (L) 05/16/2020 0900   CHOLHDL 3.3 05/16/2020 0900   LDLCALC 62 05/16/2020 0900      Wt Readings from Last 3 Encounters:  11/23/20 (!) 308 lb (139.7 kg)  05/18/20 285 lb 3.2 oz (129.4 kg)  01/19/20 293 lb 12.8 oz (133.3 kg)      Other studies Reviewed: The following studies were reviewed today: PCI 07/22/19  Prox LAD lesion is 99% stenosed.  Ost Cx to Prox Cx lesion is 50% stenosed.  Mid Cx to Dist Cx lesion is 100% stenosed.  Prox RCA lesion is 80% stenosed.  RPAV lesion is 99% stenosed.  A drug-eluting stent was successfully placed using a STENT RESOLUTE ONYX 3.5X26.  Post intervention, there is a 0% residual stenosis.  A drug-eluting stent was successfully placed using a STENT RESOLUTE ONYX 2.25X30.  Post intervention, there is a 0% residual stenosis.  A drug-eluting stent was successfully placed using a STENT RESOLUTE ONYX 4.0X18.  Post intervention, there is a 0% residual stenosis.  1. Successful PTCA/DES x 1 proximal LAD 2. Successful PTCA/DES x 1 posterolateral artery 3. Successful PTCA/DES x 1 proximal RCA  Will continue DAPT with ASA and Brilinta for one year. Continue statin and beta blocker. Likely d/c home tomorrow  after hydration post dye load. BMET in am.   Echo 06/29/19 1. Left ventricular ejection fraction, by visual estimation, is 55 to 60%. The left ventricle has normal function. Normal left ventricular size. There is mildly increased left ventricular hypertrophy. 2. Impaired diastolic annular velocities for age, early diastolic dysfunction. 3. Basal inferolateral segment and basal inferior segment are hypokinetic. 4. Global right ventricle has normal systolic function.The right ventricular size is normal. No increase in right ventricular wall thickness. 5. Left atrial size was moderately dilated. 6. Right atrial size was mildly dilated. 7. The mitral valve is normal in structure. No evidence of mitral valve regurgitation. No evidence of mitral stenosis. 8. The tricuspid valve is normal in structure. Tricuspid valve regurgitation was not visualized by color flow Doppler. 9. The aortic valve is normal in  structure. Aortic valve regurgitation was not visualized by color flow Doppler. Structurally normal aortic valve, with no evidence of sclerosis or stenosis. 10. The pulmonic valve was normal in structure. Pulmonic valve regurgitation is not visualized by color flow Doppler. 11. Normal pulmonary artery systolic pressure. 12. The inferior vena cava is normal in size with greater than 50% respiratory variability, suggesting right atrial pressure of 3 mmHg.  Cath 06/29/19  Prox RCA lesion is 80% stenosed.  RPAV lesion is 99% stenosed.  Ost Cx to Prox Cx lesion is 50% stenosed.  Prox LAD lesion is 99% stenosed.  Mid Cx to Dist Cx lesion is 100% stenosed.  1. Severe three vessel CAD 2. Severe stenosis proximal LAD. The mid and distal LAD fills briskly from antegrade flow but also seen to fill from faint right to left collaterals.  3. Moderate disease in the proximal Circumflex artery with chronic occlusion of the mid Circumflex just after the takeoff of the large obtuse marginal  branch 4. Severe focal stenosis in the proximal segment of the large dominant RCA. Severe stenosis in the the proximal segment of the moderate caliber posterolateral artery 5. Normal filling pressures.   Recommendations: Options for treatment of multi-vessel CAD include CABG vs multi-vessel stenting. Given his young age and focal nature of the disease in his RCA and LAD, I feel that stenting is the best long term option for treatment. I have reviewed with my interventional colleagues this am and they agree. I have discussed this with the patient and his wife and they agree. Will load with Brilinta 180 mg po x 1 today. Plan PCI of the RCA and LAD tomorrow.   Lexiscan 06/28/19 ([facility]) Moderate area of moderately decreased activity in inferolateral wall near base, reversible. Fixed small defect in anteroseptal region. EF 37%   ASSESSMENT AND PLAN:  1.  Coronary artery disease: The patient is medically compliant, has not been on Brilinta since September 2021.  He denies any recurrent angina symptoms or changes in his energy level.  He remains very active physically.  No plans for any cardiac testing at this time.  2.  Preoperative cardiac clearance: To have left rotator cuff repair surgery on December 27, 2020. Chart reviewed as part of pre-operative protocol coverage. Given past medical history and time since last visit, based on ACC/AHA guidelines, Randall Mullen would be at acceptable risk for the planned procedure without further cardiovascular testing.   3.  Hyperlipidemia: Continue statin therapy with goal of LDL less than 70.  LDL on 05/16/2020 was 62 with a total cholesterol of 123.  Current medicines are reviewed at length with the patient today.  I have spent 25 mins dedicated to the care of this patient on the date of this encounter to include pre-visit review of records, assessment, management and diagnostic testing,with shared decision making.  Labs/ tests ordered today include:  None apparent  At the [facility]. Liborio Nixon, ANP, AACC   11/23/2020 3:55 PM    [facility] thank you [address] Office [phone] Fax [phone]  Notice: This dictation was prepared with Dragon dictation along with smaller phrase technology. Any transcriptional errors that result from this process are unintentional and may not be corrected upon review.

## 2020-11-22 NOTE — Telephone Encounter (Signed)
Forwarded to requesting party via EPIC fax function 

## 2020-11-22 NOTE — Telephone Encounter (Signed)
   [facility] Pre-operative Risk Assessment    HEARTCARE STAFF: - Please ensure there is not already an duplicate clearance open for this procedure. - Under Visit Info/Reason for Call, type in Other and utilize the format Clearance MM/DD/YY or Clearance TBD. Do not use dashes or single digits. - If request is for dental extraction, please clarify the # of teeth to be extracted.  Request for surgical clearance:  1. What type of surgery is being performed? Lefter Rotator Cuff repair    2. When is this surgery scheduled? TBD   3. What type of clearance is required (medical clearance vs. Pharmacy clearance to hold med vs. Both)? Both   4. Are there any medications that need to be held prior to surgery and how long?ASA,Brilinta    5. Practice name and name of physician performing surgery? [place], Dr. Mia Creek Sisco   6. What is the office phone number? [phone]    7.   What is the office fax number? [phone]  8.   Anesthesia type (None, local, MAC, general) ? General    Jacqulynn Cadet 11/22/2020, 10:43 AM  _________________________________________________________________   (provider comments below)

## 2020-11-22 NOTE — Telephone Encounter (Signed)
Primary Cardiologist:Bridgette Cristal Deer, MD  Chart reviewed as part of pre-operative protocol coverage. Because of Randall Mullen's past medical history and time since last visit, he/she will require a follow-up visit in order to better assess preoperative cardiovascular risk.  Pre-op covering staff: - Please schedule appointment and call patient to inform them. - Please contact requesting surgeon's office via preferred method (i.e, phone, fax) to inform them of need for appointment prior to surgery.  If applicable, this message will also be routed to pharmacy pool and/or primary cardiologist for input on holding anticoagulant/antiplatelet agent as requested below so that this information is available at time of patient's appointment.   Ronney Asters, NP  11/22/2020, 11:26 AM

## 2020-11-23 ENCOUNTER — Encounter: Payer: Self-pay | Admitting: Adult Health

## 2020-11-23 ENCOUNTER — Other Ambulatory Visit: Payer: Self-pay

## 2020-11-23 ENCOUNTER — Ambulatory Visit (INDEPENDENT_AMBULATORY_CARE_PROVIDER_SITE_OTHER): Payer: BC Managed Care – PPO | Admitting: Adult Health

## 2020-11-23 VITALS — BP 130/78 | HR 76 | Ht 75.0 in | Wt 308.0 lb

## 2020-11-23 DIAGNOSIS — I2510 Atherosclerotic heart disease of native coronary artery without angina pectoris: Secondary | ICD-10-CM

## 2020-11-23 DIAGNOSIS — Z01818 Encounter for other preprocedural examination: Secondary | ICD-10-CM

## 2020-11-23 DIAGNOSIS — E7800 Pure hypercholesterolemia, unspecified: Secondary | ICD-10-CM | POA: Diagnosis not present

## 2020-11-23 NOTE — Patient Instructions (Signed)
Medication Instructions:  Your physician recommends that you continue on your current medications as directed. Please refer to the Current Medication list given to you today.  *If you need a refill on your cardiac medications before your next appointment, please call your pharmacy*  Lab Work: NONE ordered at this time of appointment   If you have labs (blood work) drawn today and your tests are completely normal, you will receive your results only by: MyChart Message (if you have MyChart) OR A paper copy in the mail If you have any lab test that is abnormal or we need to change your treatment, we will call you to review the results.  Testing/Procedures: NONE ordered at this time of appointment   Follow-Up: At [facility], you and your health needs are our priority.  As part of our continuing mission to provide you with exceptional heart care, we have created designated [facility] Care Teams.  These Care Teams include your primary Cardiologist (physician) and Advanced Practice Providers (APPs -  Physician Assistants and Nurse Practitioners) who all work together to provide you with the care you need, when you need it.  Your next appointment:   1 year(s)  The format for your next appointment:   In Person  Tyshay Adee:   Bridgette Christopher, MD   Other Instructions   

## 2021-01-26 ENCOUNTER — Other Ambulatory Visit: Payer: Self-pay | Admitting: Physician Assistant

## 2021-03-29 ENCOUNTER — Encounter (HOSPITAL_BASED_OUTPATIENT_CLINIC_OR_DEPARTMENT_OTHER): Payer: Self-pay

## 2021-05-24 ENCOUNTER — Other Ambulatory Visit: Payer: Self-pay | Admitting: Cardiology

## 2021-05-24 DIAGNOSIS — Z955 Presence of coronary angioplasty implant and graft: Secondary | ICD-10-CM

## 2021-05-24 DIAGNOSIS — I2510 Atherosclerotic heart disease of native coronary artery without angina pectoris: Secondary | ICD-10-CM

## 2021-05-24 NOTE — Telephone Encounter (Signed)
Rx(s) sent to pharmacy electronically.  

## 2021-06-14 ENCOUNTER — Encounter (HOSPITAL_BASED_OUTPATIENT_CLINIC_OR_DEPARTMENT_OTHER): Payer: Self-pay

## 2021-07-25 IMAGING — DX DG CHEST 1V PORT
1 series · 1 of 1 positions shown · non-contrast
Comparison: 06/27/2019

CLINICAL DATA: Shortness of breath, fever

EXAM:
PORTABLE CHEST 1 VIEW

[chest]
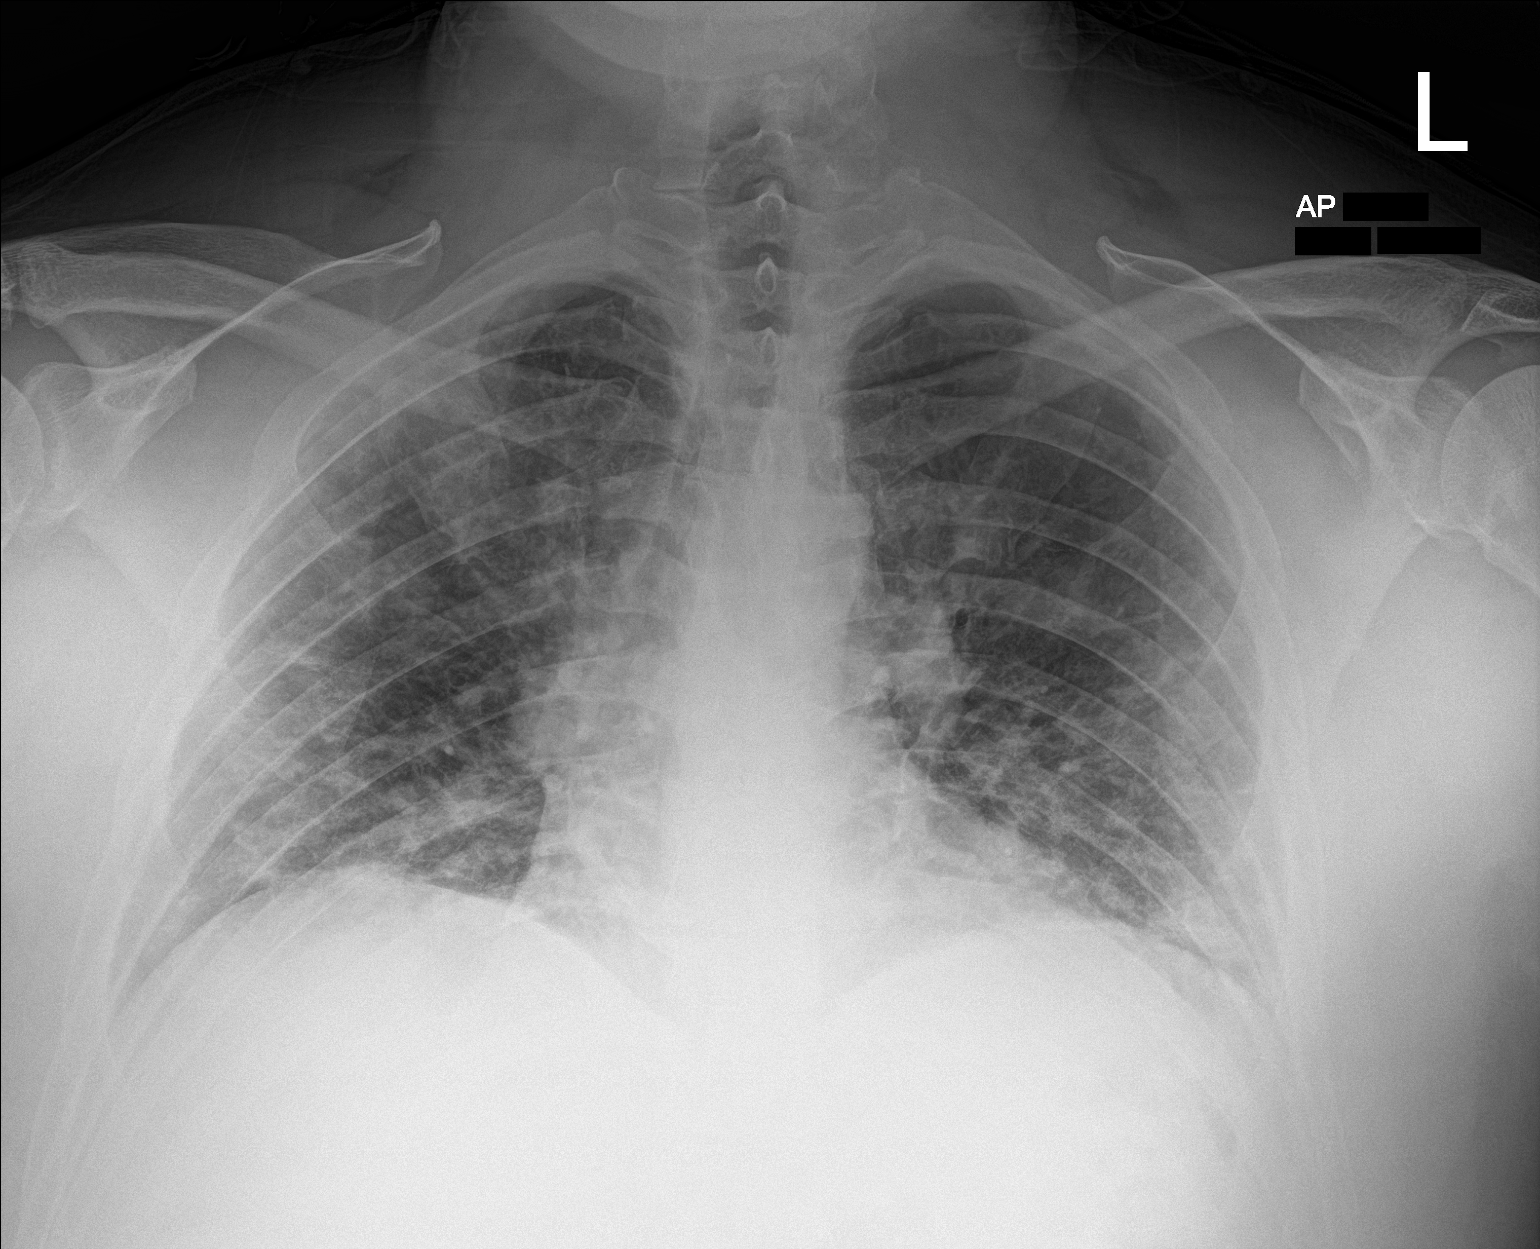

[1 of 1 positions shown; findings below may reference images not displayed]

FINDINGS: Patchy bilateral airspace opacities within the lungs. Low lung
volumes. Heart is normal size. No effusions or acute bony
abnormality.
IMPRESSION: Patchy bilateral airspace opacities concerning for pneumonia.

## 2021-11-22 ENCOUNTER — Other Ambulatory Visit: Payer: Self-pay | Admitting: Cardiology

## 2021-11-22 DIAGNOSIS — I2510 Atherosclerotic heart disease of native coronary artery without angina pectoris: Secondary | ICD-10-CM

## 2021-11-22 DIAGNOSIS — Z955 Presence of coronary angioplasty implant and graft: Secondary | ICD-10-CM

## 2021-12-11 ENCOUNTER — Ambulatory Visit (HOSPITAL_BASED_OUTPATIENT_CLINIC_OR_DEPARTMENT_OTHER): Payer: BC Managed Care – PPO | Admitting: Cardiology

## 2021-12-18 ENCOUNTER — Encounter (HOSPITAL_BASED_OUTPATIENT_CLINIC_OR_DEPARTMENT_OTHER): Payer: Self-pay

## 2021-12-20 ENCOUNTER — Ambulatory Visit (HOSPITAL_BASED_OUTPATIENT_CLINIC_OR_DEPARTMENT_OTHER): Payer: BC Managed Care – PPO | Admitting: Cardiology

## 2021-12-23 NOTE — Progress Notes (Signed)
? ?Office Visit  ?  ?Patient Name: Randall Mullen ?Date of Encounter: 12/24/2021 ? ?PCP:  Randall Stall, MD ?  ?[facility]  ?Cardiologist:  Jodelle Red, MD  ?Advanced Practice Provider:  No care team member to display ?Electrophysiologist:  None  ?   ? ?Chief Complaint  ?  ?Randall Mullen is a 51 y.o. male with a hx of HLD, CAD with NSTMI and multivessel stent presents today for CAD follow up  ? ?Past Medical History  ?  ?Past Medical History:  ?Diagnosis Date  ? GERD (gastroesophageal reflux disease)   ? Gout   ? HLD (hyperlipidemia)   ? ?Past Surgical History:  ?Procedure Laterality Date  ? CORONARY STENT INTERVENTION N/A 06/30/2019  ? Procedure: CORONARY STENT INTERVENTION;  Surgeon: Kathleene Hazel, MD;  Location: [facility];  Service: Cardiovascular;  Laterality: N/A;  ? LEFT HEART CATH AND CORONARY ANGIOGRAPHY N/A 06/29/2019  ? Procedure: LEFT HEART CATH AND CORONARY ANGIOGRAPHY;  Surgeon: Kathleene Hazel, MD;  Location: [facility];  Service: Cardiovascular;  Laterality: N/A;  ? VENTRAL HERNIA REPAIR    ? ? ?Allergies ? ?No Known Allergies ? ?History of Present Illness  ?  ?Randall Mullen is a 51 y.o. male with a hx of HLD, CAD with NSTMI and multivessel stent last seen 11/23/20 by Joni Reining, NP. ? ?Prior NSTEMI and PCI 06/2019 with PTCA/DES to LAD, , posterolateral artery, prox RCA. Echo at the time with LVEF 55-60%, mild lVH, early diastolic dysfunction, no significant valvular abnormalities.  ? ?When seen 05/2020 Plavix was discontinued and Metoprolol Tartrate consolidated to Metoprolol Succinate. He was seen 11/2020 for preoperative clearance for left rotator cuff repair doing well from a cardiac perspective.  ? ?He presents today for follow up. Endorses doing well since last seen. Continues to work out daily. He lifts daily and also does 20 minutes of cardio daily. 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 Occasional  chest discomfort described as tightness when he lays in a reclined position. Discussed likely etiology PVC. Overall not bothersome but wanted to make [place] aware. No exertional chest discomfort nor shortness of breath.   ? ?EKGs/Labs/Other Studies Reviewed:  ? ?The following studies were reviewed today: ?PCI 06/30/19 ?Prox LAD lesion is 99% stenosed. ?Ost Cx to Prox Cx lesion is 50% stenosed. ?Mid Cx to Dist Cx lesion is 100% stenosed. ?Prox RCA lesion is 80% stenosed. ?RPAV lesion is 99% stenosed. ?A drug-eluting stent was successfully placed using a STENT RESOLUTE ONYX 3.5X26. ?Post intervention, there is a 0% residual stenosis. ?A drug-eluting stent was successfully placed using a STENT RESOLUTE ONYX 2.25X30. ?Post intervention, there is a 0% residual stenosis. ?A drug-eluting stent was successfully placed using a STENT RESOLUTE ONYX 4.0X18. ?Post intervention, there is a 0% residual stenosis. ?  ?1. Successful PTCA/DES x 1 proximal LAD ?2. Successful PTCA/DES x 1 posterolateral artery ?3. Successful PTCA/DES x 1 proximal RCA ?  ?Will continue DAPT with ASA and Brilinta for one year. Continue statin and beta blocker. Likely d/c home tomorrow after hydration post dye load. BMET in am.  ?  ?Echo 06/29/19 ? 1. Left ventricular ejection fraction, by visual estimation, is 55 to 60%. The left ventricle has normal function. Normal left ventricular size. There is mildly increased left ventricular hypertrophy. ? 2. Impaired diastolic annular velocities for age, early diastolic dysfunction. ? 3. Basal inferolateral segment and basal inferior segment are hypokinetic. ? 4. Global right ventricle has normal  systolic function.The right ventricular size is normal. No increase in right ventricular wall thickness. ? 5. Left atrial size was moderately dilated. ? 6. Right atrial size was mildly dilated. ? 7. The mitral valve is normal in structure. No evidence of mitral valve regurgitation. No evidence of mitral stenosis. ? 8. The  tricuspid valve is normal in structure. Tricuspid valve regurgitation was not visualized by color flow Doppler. ? 9. The aortic valve is normal in structure. Aortic valve regurgitation was not visualized by color flow Doppler. Structurally normal aortic valve, with no evidence of sclerosis or stenosis. ?10. The pulmonic valve was normal in structure. Pulmonic valve regurgitation is not visualized by color flow Doppler. ?11. Normal pulmonary artery systolic pressure. ?12. The inferior vena cava is normal in size with greater than 50% respiratory variability, suggesting right atrial pressure of 3 mmHg. ?  ?Cath 06/29/19 ?Prox RCA lesion is 80% stenosed. ?RPAV lesion is 99% stenosed. ?Ost Cx to Prox Cx lesion is 50% stenosed. ?Prox LAD lesion is 99% stenosed. ?Mid Cx to Dist Cx lesion is 100% stenosed. ?  ?1. Severe three vessel CAD ?2. Severe stenosis proximal LAD. The mid and distal LAD fills briskly from antegrade flow but also seen to fill from faint right to left collaterals.  ?3. Moderate disease in the proximal Circumflex artery with chronic occlusion of the mid Circumflex just after the takeoff of the large obtuse marginal branch ?4. Severe focal stenosis in the proximal segment of the large dominant RCA. Severe stenosis in the the proximal segment of the moderate caliber posterolateral artery ?5. Normal filling pressures.  ?  ?Recommendations: Options for treatment of multi-vessel CAD include CABG vs multi-vessel stenting. Given his young age and focal nature of the disease in his RCA and LAD, I feel that stenting is the best long term option for treatment.  I have reviewed with my interventional colleagues this am and they agree. I have discussed this with the patient and his wife and they agree. Will load with Brilinta 180 mg po x 1 today. Plan PCI of the RCA and LAD tomorrow.  ?  ?Lexiscan 06/28/19 [facility]) ?Moderate area of moderately decreased activity in inferolateral wall near base, reversible. Fixed  small defect in anteroseptal region. EF 37% ? ?EKG:  EKG is  ordered today.  The ekg ordered today demonstrates NSR 63 bpm with single PVC and no acute ST/T wave changes.  ? ?Recent Labs: ?No results found for requested labs within last 8760 hours.  ?Recent Lipid Panel ?   ?Component Value Date/Time  ? CHOL 123 05/16/2020 0900  ? TRIG 139 05/16/2020 0900  ? HDL 37 (L) 05/16/2020 0900  ? CHOLHDL 3.3 05/16/2020 0900  ? LDLCALC 62 05/16/2020 0900  ? ?Home Medications  ? ?Current Meds  ?Medication Sig  ? aspirin EC 81 MG tablet Take 81 mg by mouth daily.  ? indomethacin (INDOCIN) 25 MG capsule Take 25 mg by mouth daily as needed. Gout flare-up  ? omeprazole (PRILOSEC) 40 MG capsule Take 40 mg by mouth daily as needed (reflux/heartburn).   ? TADALAFIL PO Take 9-18 mg by mouth as needed (ED). (chewable tablets)  ? Vitamin D, Ergocalciferol, (DRISDOL) 1.25 MG (50000 UT) CAPS capsule Take 50,000 Units by mouth every Sunday.   ? [DISCONTINUED] atorvastatin (LIPITOR) 80 MG tablet TAKE 1 TABLET (80 MG TOTAL) BY MOUTH DAILY AT 6 PM.  ? [DISCONTINUED] metoprolol succinate (TOPROL-XL) 25 MG 24 hr tablet TAKE 1 TABLET BY MOUTH EVERY DAY  ?  ?  Review of Systems  ?    ?All other systems reviewed and are otherwise negative except as noted above. ? ?Physical Exam  ?  ?VS:  BP 120/90 (BP Location: Right Arm, Patient Position: Sitting, Cuff Size: Large)   Pulse 63   Ht 6\' 3"  (1.905 m)   Wt (!) 300 lb 11.2 oz (136.4 kg)   BMI 37.58 kg/m?  , BMI Body mass index is 37.58 kg/m?. ? ?Wt Readings from Last 3 Encounters:  ?12/24/21 (!) 300 lb 11.2 oz (136.4 kg)  ?11/23/20 (!) 308 lb (139.7 kg)  ?05/18/20 285 lb 3.2 oz (129.4 kg)  ?  ?GEN: Well nourished, well developed, in no acute distress. ?HEENT: normal. ?Neck: Supple, no JVD, carotid bruits, or masses. ?Cardiac: RRR, no murmurs, rubs, or gallops. No clubbing, cyanosis, edema.  Radials/PT 2+ and equal bilaterally.  ?Respiratory:  Respirations regular and unlabored, clear to auscultation  bilaterally. ?GI: Soft, nontender, nondistended. ?MS: No deformity or atrophy. ?Skin: Warm and dry, no rash. ?Neuro:  Strength and sensation are intact. ?Psych: Normal affect. ? ?Assessment & Plan  ?  ?CAD - S

## 2021-12-24 ENCOUNTER — Encounter (HOSPITAL_BASED_OUTPATIENT_CLINIC_OR_DEPARTMENT_OTHER): Payer: Self-pay | Admitting: Family

## 2021-12-24 ENCOUNTER — Ambulatory Visit (INDEPENDENT_AMBULATORY_CARE_PROVIDER_SITE_OTHER): Payer: BC Managed Care – PPO | Admitting: Family

## 2021-12-24 VITALS — BP 120/90 | HR 63 | Ht 75.0 in | Wt 300.7 lb

## 2021-12-24 DIAGNOSIS — I493 Ventricular premature depolarization: Secondary | ICD-10-CM | POA: Diagnosis not present

## 2021-12-24 DIAGNOSIS — I25118 Atherosclerotic heart disease of native coronary artery with other forms of angina pectoris: Secondary | ICD-10-CM | POA: Diagnosis not present

## 2021-12-24 DIAGNOSIS — E785 Hyperlipidemia, unspecified: Secondary | ICD-10-CM | POA: Diagnosis not present

## 2021-12-24 MED ORDER — METOPROLOL SUCCINATE ER 25 MG PO TB24: 25.0000 mg | ORAL_TABLET | Freq: Every day | ORAL | 3 refills | Status: DC

## 2021-12-24 MED ORDER — ATORVASTATIN CALCIUM 80 MG PO TABS: 80.0000 mg | ORAL_TABLET | Freq: Every day | ORAL | 3 refills | Status: DC

## 2021-12-24 MED ORDER — NITROGLYCERIN 0.4 MG SL SUBL: 0.4000 mg | SUBLINGUAL_TABLET | SUBLINGUAL | 12 refills | Status: DC | PRN

## 2021-12-24 NOTE — Patient Instructions (Signed)
Medication Instructions:  ?Continue your current medications.  ? ?*If you need a refill on your cardiac medications before your next appointment, please call your pharmacy* ? ? ?Lab Work: ?Your physician recommends that you return for lab work today: CMET, fasting lipid panel  ? ?If you have labs (blood work) drawn today and your tests are completely normal, you will receive your results only by: ?MyChart Message (if you have MyChart) OR ?A paper copy in the mail ?If you have any lab test that is abnormal or we need to change your treatment, we will call you to review the results. ? ? ?Testing/Procedures: ?Your EKG today showed normal sinus rhythm. It showed an occasional early beat called a PVC (premature ventricular contraction) this is a common finding and not dangerous. ? ?Follow-Up: ?At [facility], you and your health needs are our priority.  As part of our continuing mission to provide you with exceptional heart care, we have created designated Provider Care Teams.  These Care Teams include your primary Cardiologist (physician) and Advanced Practice Providers (APPs -  Physician Assistants and Nurse Practitioners) who all work together to provide you with the care you need, when you need it. ? ?We recommend signing up for the patient portal called "MyChart".  Sign up information is provided on this After Visit Summary.  MyChart is used to connect with patients for Virtual Visits (Telemedicine).  Patients are able to view lab/test results, encounter notes, upcoming appointments, etc.  Non-urgent messages can be sent to your provider as well.   ?To learn more about what you can do with MyChart, go to [email or web address].   ? ?Your next appointment:   ?1 year(s) ? ?The format for your next appointment:   ?In Person ? ?Provider:   ?Buford Dresser, MD  ? ? ?Other Instructions ? ?Heart Healthy Diet Recommendations: ?A low-salt diet is recommended. Meats should be grilled, baked, or boiled. Avoid fried  foods. Focus on lean protein sources like fish or chicken with vegetables and fruits. The American Heart Association is a Microbiologist!  American Heart Association Diet and Lifeystyle Recommendations   ? ?Exercise recommendations: ?The American Heart Association recommends 150 minutes of moderate intensity exercise weekly. ?Try 30 minutes of moderate intensity exercise 4-5 times per week. ?This could include walking, jogging, or swimming. ? ?Important Information About Sugar ? ? ? ? ?  ?

## 2021-12-25 ENCOUNTER — Telehealth (HOSPITAL_BASED_OUTPATIENT_CLINIC_OR_DEPARTMENT_OTHER): Payer: Self-pay

## 2021-12-25 LAB — COMPREHENSIVE METABOLIC PANEL
ALT: 29 IU/L (ref 0–44)
AST: 23 IU/L (ref 0–40)
Albumin/Globulin Ratio: 2 (ref 1.2–2.2)
Albumin: 4.5 g/dL (ref 4.0–5.0)
Alkaline Phosphatase: 59 IU/L (ref 44–121)
BUN/Creatinine Ratio: 11 (ref 9–20)
BUN: 13 mg/dL (ref 6–24)
Bilirubin Total: 0.3 mg/dL (ref 0.0–1.2)
CO2: 25 mmol/L (ref 20–29)
Calcium: 9.6 mg/dL (ref 8.7–10.2)
Chloride: 102 mmol/L (ref 96–106)
Creatinine, Ser: 1.16 mg/dL (ref 0.76–1.27)
Globulin, Total: 2.3 g/dL (ref 1.5–4.5)
Glucose: 112 mg/dL — ABNORMAL HIGH (ref 70–99)
Potassium: 4.3 mmol/L (ref 3.5–5.2)
Sodium: 143 mmol/L (ref 134–144)
Total Protein: 6.8 g/dL (ref 6.0–8.5)
eGFR: 78 mL/min/{1.73_m2} (ref >59)

## 2021-12-25 LAB — LIPID PANEL
Chol/HDL Ratio: 3.9 ratio (ref 0.0–5.0)
Cholesterol, Total: 136 mg/dL (ref 100–199)
HDL: 35 mg/dL — ABNORMAL LOW (ref >39)
LDL Chol Calc (NIH): 73 mg/dL (ref 0–99)
Triglycerides: 163 mg/dL — ABNORMAL HIGH (ref 0–149)
VLDL Cholesterol Cal: 28 mg/dL (ref 5–40)

## 2021-12-25 NOTE — Telephone Encounter (Addendum)
Left message for patient to call back ? ? ? ? ?----- Message from Loel Dubonnet, NP sent at 12/25/2021  8:07 AM EDT ----- ?Normal kidney, liver, electrolytes. Cholesterol panel with slightly elevated triglycerides - recommend reducing intake of sweets, sugars, carbohydrates.  LDL (bad cholesterol) of 73 with goal of less than 70. Slightly above goal. Continue Atorvastatin 80mg  daily.  ? ?Can either make dietary changes and recheck in 6 months or add Zetia 10mg  daily.  ?

## 2022-01-09 ENCOUNTER — Encounter (HOSPITAL_BASED_OUTPATIENT_CLINIC_OR_DEPARTMENT_OTHER): Payer: Self-pay

## 2022-01-09 NOTE — Telephone Encounter (Signed)
Please advise 

## 2022-03-10 ENCOUNTER — Encounter (HOSPITAL_BASED_OUTPATIENT_CLINIC_OR_DEPARTMENT_OTHER): Payer: Self-pay

## 2022-09-23 NOTE — Progress Notes (Signed)
Cardiology Office Note:    Date:  11/24/2022   ID:  Randall Mullen, DOB 04/04/1971, MRN [id number]  PCP:  Baker, Clifton, MD  Cardiologist:  Sydnee Lamour, MD  Referring MD: Baker, Clifton, MD   CC: follow up  History of Present Illness:    Randall Mullen is a 51 y.o. 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  CV history: NSTEMI, considered for CABG vs stents, recommended for stents and received 06/2019. Sister and dad had fatty liver. After shared decision making we changed to aspirin alone starting after 06/29/20. We changed to metoprolol succinate 25 mg daily for ease of administration.   At his last appointment he complained of infrequent chest tightness that was distinct from the pain he felt prior to his stents. At home his blood pressure was more elevated at 150-155/80-85. He was frustrated with not being able to lose weight despite routine exercise and a good diet. Occasionally he took a diuretic to sweat more with exercise. He had difficulty with obtaining his initial doses of Wegovy due to limited supply. Additionally he was concerned about low testosterone. We discussed the known affects of high testosterone on the heart and advised him to follow-up with his PCP.  He followed up with our pharmacist 10/07/2022 and was amenable to starting Repatha 140 mg. Subsequent lab work was consistent with diabetes. Wegovy was transitioned to Ozempic. He reported taking the initial doses of both Repatha and Ozempic on 10/27/22.  Today, he is feeling good. He is down 21 lbs since his peak of 308 lbs. Today will be his 5th dosing of his Ozempic 0.25 mg. His next injection will be 0.5 mg. He has had about 3-4 doses of Repatha. However, he believes that he is developing side effects from Repatha. For 4 weeks now he has noticed a dry cough and significant back pain, especially with sitting and  standing. His back pain is localized to his right lower back, almost to his hip. While walking on the treadmill his back pain will improve. He denies any prior injuries to his back. There is a palpable muscle knot on exam.  Also he has noticed a decrease in his appetite. He endorses a side effect of gas from his medications after eating. No irregular bowel movements.  For exercise he is completing 45 minutes of cardio in the mornings. He then returns to the gym in the evenings for weight training.  He denies any palpitations, chest pain, shortness of breath, or peripheral edema. No lightheadedness, headaches, syncope, orthopnea, or PND.   Past Medical History:  Diagnosis Date   GERD (gastroesophageal reflux disease)    Gout    HLD (hyperlipidemia)     Past Surgical History:  Procedure Laterality Date   CORONARY STENT INTERVENTION N/A 06/30/2019   Procedure: CORONARY STENT INTERVENTION;  Surgeon: McAlhany, Mallarie Voorhies D, MD;  Location: [facility]VAS[facility];  Service: Cardiovascular;  Laterality: N/A;   LEFT HEART CATH AND CORONARY ANGIOGRAPHY N/A 06/29/2019   Procedure: LEFT HEART CATH AND CORONARY ANGIOGRAPHY;  Surgeon: McAlhany, Nila Winker D, MD;  Location: [facility] INVASIVE CV LAB;  Service: Cardiovascular;  Laterality: N/A;   VENTRAL HERNIA REPAIR      Current Medications: Current Outpatient Medications on File Prior to Visit  Medication Sig   aspirin EC 81 MG tablet Take 81 mg by mouth daily.   atorvastatin (LIPITOR) 80 MG tablet Take 1 tablet (  80 mg total) by mouth daily at 6 PM.   Evolocumab (REPATHA SURECLICK) 140 MG/ML SOAJ Inject 140 mg into the skin every 14 (fourteen) days.   indomethacin (INDOCIN) 25 MG capsule Take 25 mg by mouth daily as needed. Gout flare-up   metoprolol succinate (TOPROL-XL) 25 MG 24 hr tablet Take 1 tablet (25 mg total) by mouth daily.   omeprazole (PRILOSEC) 40 MG capsule Take 40 mg by mouth daily as needed (reflux/heartburn).    Semaglutide, 1 MG/DOSE, 4  MG/3ML SOPN Inject 1 mg into the skin once a week.   Semaglutide,0.25 or 0.5MG/DOS, 2 MG/3ML SOPN Inject 0.25 mg once weekly for 4 weeks, then increase to 0.5 mg weekly for 6 weeks total   TADALAFIL PO Take 9-18 mg by mouth as needed (ED). (chewable tablets)   valsartan (DIOVAN) 80 MG tablet Take 1 tablet (80 mg total) by mouth daily.   Vitamin D, Ergocalciferol, (DRISDOL) 1.25 MG (50000 UT) CAPS capsule Take 50,000 Units by mouth every Sunday.    nitroGLYCERIN (NITROSTAT) 0.4 MG SL tablet Place 1 tablet (0.4 mg total) under the tongue every 5 (five) minutes as needed for up to 15 days for chest pain.   No current facility-administered medications on file prior to visit.     Allergies:   Patient has no known allergies.   Social History   Tobacco Use   Smoking status: Never   Smokeless tobacco: Never  Substance Use Topics   Alcohol use: Yes   Drug use: Never    Family History: family history includes CAD in his father; Heart attack in his paternal grandmother.  ROS:   Please see the history of present illness.   (+) Dry cough (+) Back pain Additional pertinent ROS otherwise unremarkable.  EKGs/Labs/Other Studies Reviewed:    The following studies were reviewed today:  Echo  08/17/2019:  1. Left ventricular ejection fraction, by visual estimation, is 60 to  65%. The left ventricle has normal function. There is mildly increased  left ventricular hypertrophy.   2. Global right ventricle has normal systolic function.The right  ventricular size is mildly enlarged.   3. Left atrial size was normal.   4. Right atrial size was normal.   5. The mitral valve is normal in structure. No evidence of mitral valve  regurgitation.   6. The tricuspid valve is normal in structure. Tricuspid valve  regurgitation is not demonstrated.   7. The aortic valve is tricuspid. Aortic valve regurgitation is not  visualized.   8. The pulmonic valve was not well visualized. Pulmonic valve   regurgitation not assessed.   9. TR signal is inadequate for assessing pulmonary artery systolic  pressure.   PCI 06/30/19 Prox LAD lesion is 99% stenosed. Ost Cx to Prox Cx lesion is 50% stenosed. Mid Cx to Dist Cx lesion is 100% stenosed. Prox RCA lesion is 80% stenosed. RPAV lesion is 99% stenosed. A drug-eluting stent was successfully placed using a STENT RESOLUTE ONYX 3.5X26. Post intervention, there is a 0% residual stenosis. A drug-eluting stent was successfully placed using a STENT RESOLUTE ONYX 2.25X30. Post intervention, there is a 0% residual stenosis. A drug-eluting stent was successfully placed using a STENT RESOLUTE ONYX 4.0X18. Post intervention, there is a 0% residual stenosis.   1. Successful PTCA/DES x 1 proximal LAD 2. Successful PTCA/DES x 1 posterolateral artery 3. Successful PTCA/DES x 1 proximal RCA   Will continue DAPT with ASA and Brilinta for one year. Continue statin and beta blocker. Likely   d/c home tomorrow after hydration post dye load. BMET in am.    Echo 06/29/19  1. Left ventricular ejection fraction, by visual estimation, is 55 to 60%. The left ventricle has normal function. Normal left ventricular size. There is mildly increased left ventricular hypertrophy.  2. Impaired diastolic annular velocities for age, early diastolic dysfunction.  3. Basal inferolateral segment and basal inferior segment are hypokinetic.  4. Global right ventricle has normal systolic function.The right ventricular size is normal. No increase in right ventricular wall thickness.  5. Left atrial size was moderately dilated.  6. Right atrial size was mildly dilated.  7. The mitral valve is normal in structure. No evidence of mitral valve regurgitation. No evidence of mitral stenosis.  8. The tricuspid valve is normal in structure. Tricuspid valve regurgitation was not visualized by color flow Doppler.  9. The aortic valve is normal in structure. Aortic valve regurgitation was  not visualized by color flow Doppler. Structurally normal aortic valve, with no evidence of sclerosis or stenosis. 10. The pulmonic valve was normal in structure. Pulmonic valve regurgitation is not visualized by color flow Doppler. 11. Normal pulmonary artery systolic pressure. 12. The inferior vena cava is normal in size with greater than 50% respiratory variability, suggesting right atrial pressure of 3 mmHg.   Cath 06/29/19 Prox RCA lesion is 80% stenosed. RPAV lesion is 99% stenosed. Ost Cx to Prox Cx lesion is 50% stenosed. Prox LAD lesion is 99% stenosed. Mid Cx to Dist Cx lesion is 100% stenosed.   1. Severe three vessel CAD 2. Severe stenosis proximal LAD. The mid and distal LAD fills briskly from antegrade flow but also seen to fill from faint right to left collaterals.  3. Moderate disease in the proximal Circumflex artery with chronic occlusion of the mid Circumflex just after the takeoff of the large obtuse marginal branch 4. Severe focal stenosis in the proximal segment of the large dominant RCA. Severe stenosis in the the proximal segment of the moderate caliber posterolateral artery 5. Normal filling pressures.    Recommendations: Options for treatment of multi-vessel CAD include CABG vs multi-vessel stenting. Given his young age and focal nature of the disease in his RCA and LAD, I feel that stenting is the best long term option for treatment.  I have reviewed with my interventional colleagues this am and they agree. I have discussed this with the patient and his wife and they agree. Will load with Brilinta 180 mg po x 1 today. Plan PCI of the RCA and LAD tomorrow.    Lexiscan 06/28/19 ([facility]) Moderate area of moderately decreased activity in inferolateral wall near base, reversible. Fixed small defect in anteroseptal region. EF 37%  EKG:  EKG is personally reviewed.   11/24/2022:  EKG was not ordered. 09/24/2022:  NSR at 82 bpm wih nonspecific ST pattern 10/20/19: NSR with  anterolateral TWI  Recent Labs: 12/24/2021: ALT 29 11/21/2022: BUN 14; Creatinine, Ser 1.34; Potassium 4.6; Sodium 141   Recent Lipid Panel    Component Value Date/Time   CHOL 144 10/15/2022 1557   TRIG 200 (H) 10/15/2022 1557   HDL 36 (L) 10/15/2022 1557   CHOLHDL 4.0 10/15/2022 1557   LDLCALC 74 10/15/2022 1557     Physical Exam:    VS:  BP 126/82   Pulse 87   Ht 6' 3" (1.905 m)   Wt 287 lb (130.2 kg)   BMI 35.87 kg/m     Wt Readings from Last 3 Encounters:  11/24/22 287   lb (130.2 kg)  10/07/22 (!) 303 lb 9.2 oz (137.7 kg)  09/24/22 (!) 308 lb 6.4 oz (139.9 kg)    GEN: Well nourished, well developed in no acute distress HEENT: Normal, moist mucous membranes NECK: No JVD CARDIAC: regular rhythm, normal S1 and S2, no rubs or gallops. No murmur. VASCULAR: Radial and DP pulses 2+ bilaterally. No carotid bruits RESPIRATORY:  Clear to auscultation without rales, wheezing or rhonchi  ABDOMEN: Soft, non-tender, non-distended MUSCULOSKELETAL:  Ambulates independently. ***Palpable muscle knot of right lower back. SKIN: Warm and dry, no edema NEUROLOGIC:  Alert and oriented x 3. No focal neuro deficits noted. PSYCHIATRIC:  Normal affect   ASSESSMENT:    No diagnosis found.   PLAN:    3V CAD s/p PCI 06/30/2019. -was on DAPT for one year, now on aspirin 81 mg daily since 2021 -continue atorvastatin 80 mg daily. LDL not at goal, last 73. We discussed PCSK9i, and given his young age and extensive disease, I would aim for LDL <55. He is amenable to PCSK9i, though not excited about injection he feels he could self administer -counseled on diet, exercise -discussed PDE5 inhibitors and risk with SL NG. Ok to use PDEi but need to alert any EMS/ER to use if he has chest pain to alert them to life threatening interaction with nitrate. -normal EF -continue metoprolol succinate 25 mg daily for ease of administration. Feels well on this  Atypical chest pain -reviewed red flag warning  signs that need immediate medical attention   Mixed hyperlipidemia -see recent lipids, LDL 73, TG 163 -normal lp(a) -continue atorvastatin 80 mg daily, adding PCSK9i as above  Obesity -BMI 38 -he has been prescribed Wegovy by his PCP but has not been able to find at a pharmacy. We unfortunately do not have samples at this time. I will continue to try to find samples for him of the 0.25 mg weekly dose and hopefully he would be able to find the 0.5 mg dose -I think GLP1 is an excellent choice for him given his CAD  Additional CV risk: Family history: father with 6 caths, multiple family members with MI in late 40s/50s Impaired glucose tolerance: A1c 6.2 No tobacco use No history of HTN  Cardiac risk counseling and prevention recommendations: -recommend heart healthy/Mediterranean diet, with whole grains, fruits, vegetable, fish, lean meats, nuts, and olive oil. Limit salt. -recommend moderate walking, 3-5 times/week for 30-50 minutes each session. Aim for at least 150 minutes.week. Goal should be pace of 3 miles/hours, or walking 1.5 miles in 30 minutes -recommend avoidance of tobacco products. Avoid excess alcohol.  Plan for follow up: 3 months or sooner as needed.   Zanobia Griebel, MD, PhD, FACC [place]  [facility]    Medication Adjustments/Labs and Tests Ordered: Current medicines are reviewed at length with the patient today.  Concerns regarding medicines are outlined above.   No orders of the defined types were placed in this encounter.  No orders of the defined types were placed in this encounter.  Patient Instructions  Medication Instructions:  Your physician recommends that you continue on your current medications as directed. Please refer to the Current Medication list given to you today.  *If you need a refill on your cardiac medications before your next appointment, please call your pharmacy*  Lab  Work: NONE  Testing/Procedures: NONE  Follow-Up: At [facility], you and your health needs are our priority.  As part of our continuing mission to provide you with exceptional   heart care, we have created designated Provider Care Teams.  These Care Teams include your primary Cardiologist (physician) and Advanced Practice Providers (APPs -  Physician Assistants and Nurse Practitioners) who all work together to provide you with the care you need, when you need it.  We recommend signing up for the patient portal called "MyChart".  Sign up information is provided on this After Visit Summary.  MyChart is used to connect with patients for Virtual Visits (Telemedicine).  Patients are able to view lab/test results, encounter notes, upcoming appointments, etc.  Non-urgent messages can be sent to your provider as well.   To learn more about what you can do with MyChart, go to [email or web address].    Your next appointment:   3 month(s)  The format for your next appointment:   In Person  Provider:   Daneka Lantigua, MD       I,Mathew Stumpf,acting as a scribe for Jquan Egelston, MD.,have documented all relevant documentation on the behalf of Dayne Dekay, MD,as directed by  Bushra Denman, MD while in the presence of Ladislao Cohenour, MD.  I, Mathew Stumpf, have reviewed all documentation for this visit. The documentation on 11/24/22 for the exam, diagnosis, procedures, and orders are all accurate and complete.   Signed, Haden Suder, MD PhD 11/24/2022 4:39 PM    [facility] 

## 2022-09-24 ENCOUNTER — Ambulatory Visit (HOSPITAL_BASED_OUTPATIENT_CLINIC_OR_DEPARTMENT_OTHER): Payer: BC Managed Care – PPO | Admitting: Cardiology

## 2022-09-24 ENCOUNTER — Encounter (HOSPITAL_BASED_OUTPATIENT_CLINIC_OR_DEPARTMENT_OTHER): Payer: Self-pay | Admitting: Cardiology

## 2022-09-24 VITALS — BP 148/92 | HR 81 | Ht 75.0 in | Wt 308.4 lb

## 2022-09-24 DIAGNOSIS — I10 Essential (primary) hypertension: Secondary | ICD-10-CM | POA: Diagnosis not present

## 2022-09-24 DIAGNOSIS — I25118 Atherosclerotic heart disease of native coronary artery with other forms of angina pectoris: Secondary | ICD-10-CM

## 2022-09-24 DIAGNOSIS — Z955 Presence of coronary angioplasty implant and graft: Secondary | ICD-10-CM

## 2022-09-24 DIAGNOSIS — E7800 Pure hypercholesterolemia, unspecified: Secondary | ICD-10-CM | POA: Diagnosis not present

## 2022-09-24 DIAGNOSIS — Z5181 Encounter for therapeutic drug level monitoring: Secondary | ICD-10-CM

## 2022-09-24 DIAGNOSIS — R079 Chest pain, unspecified: Secondary | ICD-10-CM

## 2022-09-24 DIAGNOSIS — Z6838 Body mass index (BMI) 38.0-38.9, adult: Secondary | ICD-10-CM

## 2022-09-24 MED ORDER — VALSARTAN 80 MG PO TABS: 80.0000 mg | ORAL_TABLET | Freq: Every day | ORAL | 3 refills | Status: DC

## 2022-09-24 NOTE — Patient Instructions (Addendum)
Medication Instructions:  START VALSARATAN 80 MG DAILY   Labwork: BMET 2-3 WEEKS   Testing/Procedures: NONE  Follow-Up: 2-3 MONTHS   You have been referred to [facility]   Any Other Special Instructions Will Be Listed Below (If Applicable).  how to check blood pressure:  -sit comfortably in a chair, feet uncrossed and flat on floor, for 5-10 minutes  -arm ideally should rest at the level of the heart. However, arm should be relaxed and not tense (for example, do not hold the arm up unsupported)  -avoid exercise, caffeine, and tobacco for at least 30 minutes prior to BP reading  -don't take BP cuff reading over clothes (always place on skin directly)  -I prefer to know how well the medication is working, so I would like you to take your readings 1-2 hours after taking your blood pressure medication if possible   Overall goal is an average of <130/80.

## 2022-09-26 ENCOUNTER — Other Ambulatory Visit (HOSPITAL_BASED_OUTPATIENT_CLINIC_OR_DEPARTMENT_OTHER): Payer: Self-pay

## 2022-09-26 ENCOUNTER — Other Ambulatory Visit (HOSPITAL_BASED_OUTPATIENT_CLINIC_OR_DEPARTMENT_OTHER): Payer: Self-pay | Admitting: Cardiology

## 2022-09-26 ENCOUNTER — Telehealth (HOSPITAL_BASED_OUTPATIENT_CLINIC_OR_DEPARTMENT_OTHER): Payer: Self-pay

## 2022-09-26 MED ORDER — SEMAGLUTIDE-WEIGHT MANAGEMENT 1 MG/0.5ML ~~LOC~~ SOAJ
1.0000 mg | SUBCUTANEOUS | 0 refills | Status: DC
  Filled 2022-09-26: qty 2, 28d supply, fill #0

## 2022-09-26 MED ORDER — SEMAGLUTIDE-WEIGHT MANAGEMENT 2.4 MG/0.75ML ~~LOC~~ SOAJ
2.4000 mg | SUBCUTANEOUS | 11 refills | Status: DC
  Filled 2022-09-26: qty 3, fill #0

## 2022-09-26 MED ORDER — SEMAGLUTIDE-WEIGHT MANAGEMENT 1.7 MG/0.75ML ~~LOC~~ SOAJ
1.7000 mg | SUBCUTANEOUS | 0 refills | Status: DC
  Filled 2022-09-26: qty 3, 28d supply, fill #0

## 2022-09-26 MED ORDER — VALSARTAN 80 MG PO TABS
80.0000 mg | ORAL_TABLET | Freq: Every day | ORAL | 3 refills | Status: DC
  Filled 2022-09-26: qty 90, 90d supply, fill #0
  Filled 2022-12-20 (×2): qty 90, 90d supply, fill #1

## 2022-09-26 MED ORDER — SEMAGLUTIDE-WEIGHT MANAGEMENT 0.5 MG/0.5ML ~~LOC~~ SOAJ
0.5000 mg | SUBCUTANEOUS | 0 refills | Status: DC
  Filled 2022-09-26: qty 2, 28d supply, fill #0

## 2022-09-26 NOTE — Addendum Note (Signed)
Addended by: Gerald Stabs on: 09/26/2022 03:29 PM   Modules accepted: Orders

## 2022-09-26 NOTE — Telephone Encounter (Signed)
Patient returned call, transferred from call center,   Advised patient of the following recommendations. He states he is on his way to our office!   Sample wegovy box given  LOT [id number]  EXP 07/16/23       ----- Message from Buford Dresser, MD sent at 09/26/2022  1:33 PM EST ----- Regarding: Wegovy samples Can you set aside 0.25 mg Wegovy samples for this patient? Let him know I am going to send the 0.5 mg weekly dose into the pharmacy at [facility]--he shouldn't start the sample dose until he has the 0.5 mg in hand. He said he did the injection teaching with his PCP, but if he needs a refresher he should let us know. Thanks!

## 2022-09-26 NOTE — Telephone Encounter (Addendum)
Attempted to contact patient, no answer, Left message for patient to call back    ----- Message from Buford Dresser, MD sent at 09/26/2022  1:33 PM EST ----- Regarding: Wegovy samples Can you set aside 0.25 mg Wegovy samples for this patient? Let him know I am going to send the 0.5 mg weekly dose into the pharmacy at [facility]--he shouldn't start the sample dose until he has the 0.5 mg in hand. He said he did the injection teaching with his PCP, but if he needs a refresher he should let us know. Thanks!

## 2022-09-29 ENCOUNTER — Other Ambulatory Visit (HOSPITAL_BASED_OUTPATIENT_CLINIC_OR_DEPARTMENT_OTHER): Payer: Self-pay

## 2022-10-01 ENCOUNTER — Other Ambulatory Visit: Payer: Self-pay

## 2022-10-01 ENCOUNTER — Other Ambulatory Visit (HOSPITAL_BASED_OUTPATIENT_CLINIC_OR_DEPARTMENT_OTHER): Payer: Self-pay

## 2022-10-02 ENCOUNTER — Encounter (HOSPITAL_BASED_OUTPATIENT_CLINIC_OR_DEPARTMENT_OTHER): Payer: Self-pay | Admitting: Pharmacist

## 2022-10-02 ENCOUNTER — Other Ambulatory Visit (HOSPITAL_BASED_OUTPATIENT_CLINIC_OR_DEPARTMENT_OTHER): Payer: Self-pay

## 2022-10-02 NOTE — Telephone Encounter (Signed)
Message sent to pharm.

## 2022-10-03 ENCOUNTER — Other Ambulatory Visit (HOSPITAL_BASED_OUTPATIENT_CLINIC_OR_DEPARTMENT_OTHER): Payer: Self-pay

## 2022-10-07 ENCOUNTER — Other Ambulatory Visit (HOSPITAL_BASED_OUTPATIENT_CLINIC_OR_DEPARTMENT_OTHER): Payer: Self-pay

## 2022-10-07 ENCOUNTER — Encounter (HOSPITAL_BASED_OUTPATIENT_CLINIC_OR_DEPARTMENT_OTHER): Payer: Self-pay | Admitting: Pharmacist Clinician (PhC)/ Clinical Pharmacy Specialist

## 2022-10-07 ENCOUNTER — Ambulatory Visit (HOSPITAL_BASED_OUTPATIENT_CLINIC_OR_DEPARTMENT_OTHER): Payer: BC Managed Care – PPO | Admitting: Pharmacist Clinician (PhC)/ Clinical Pharmacy Specialist

## 2022-10-07 VITALS — Ht 75.0 in | Wt 303.6 lb

## 2022-10-07 DIAGNOSIS — E669 Obesity, unspecified: Secondary | ICD-10-CM

## 2022-10-07 DIAGNOSIS — E782 Mixed hyperlipidemia: Secondary | ICD-10-CM | POA: Diagnosis not present

## 2022-10-07 DIAGNOSIS — R7303 Prediabetes: Secondary | ICD-10-CM | POA: Diagnosis not present

## 2022-10-07 DIAGNOSIS — E7800 Pure hypercholesterolemia, unspecified: Secondary | ICD-10-CM | POA: Diagnosis not present

## 2022-10-07 NOTE — Patient Instructions (Signed)
Your Results:             Your most recent labs Goal  Total Cholesterol 136 < 200  Triglycerides 163 < 150  HDL (happy/good cholesterol) 35 > 40  LDL (lousy/bad cholesterol 73 < 55   Medication changes:  We will start the process to get Repatha covered by your insurance.  Once the prior authorization is complete, I will call/send a MyChart message to let you know and confirm pharmacy information.   You will take one injection every 14 days  Lab orders:  We want to repeat labs after 2-3 months.  We will send you a lab order to remind you once we get closer to that time.    Patient Assistance:  go to [email or web address] and follow the prompts for help with paying for the medication.   Thank you for choosing [facility]

## 2022-10-07 NOTE — Assessment & Plan Note (Signed)
Assessment: Patient with ASCVD not at LDL goal of < 55 Most recent LDL 73 on 12/24/21 Has been compliant with high intensity statin:  atorvastatin 80  Reviewed PCSK-9 inhibitor.  Discussed mechanisms of action, dosing, side effects, potential decreases in LDL cholesterol and costs.  Also reviewed potential options for patient assistance.  Plan: Patient agreeable to starting Repatha 140 mg q14d Repeat labs after:  3 months Lipid Liver function

## 2022-10-07 NOTE — Progress Notes (Unsigned)
Office Visit    Patient Name: Randall Mullen Date of Encounter: 10/07/2022  Primary Care Provider:  Guadalupe Maple, MD Primary Cardiologist:  Buford Dresser, MD  Chief Complaint    Hyperlipidemia   Significant Past Medical History   CAD NSTEMI w/stents; intermittent atypical chest pain  gout Occurs randomly - will avoid bempedoic acid  obesity BMI 37.9     No Known Allergies  History of Present Illness    Randall Mullen is a 52 y.o. male patient of Dr Harrell Gave, who is in the office today to discuss Repatha for cholesterol lowering.  He has a strong family history of heart disease (noted below) and has not been able to get to LDL goal of < 55.  He has been working with the [employer] to try and get Wegovy, but insurance has been challenging so far.  He does have a sample of the 0.25 mg dose at home, but is waiting for the next to be approved before starting.  He is a former bodybuilder and high Archivist.    Insurance Carrier: [employer]   LDL Cholesterol goal:  LDL < 55  Current Medications:   atorvastatin 80  Diet: mix of home and eating out, some fast foods/cafeteria; no snacking, no pork, mostlly chicken; vegetables frozen  Exercise: work out 7 days per week - treadmill 35-40 min, 3.5 mph 7 degree incline, then weights for another 30-45 min  Family History: father side, paternal died 97 from [place]; dad 5 siblings, 1 died 6 CHF, another at 52, all brothers with CABG or stents; mom healthy; younger sister healthy; children healthy 18-22  Social History:   Tobacco:no  Alcohol: rare  Caffeine: coffee two cups daily at the most  Adherence Assessment  Do you ever forget to take your medication? [] Yes [x] No  Do you ever skip doses due to side effects? [] Yes [x] No  Do you have trouble affording your medicines? [x] Yes - Wegovy [] No  Are you ever unable to pick up your medication due to transportation difficulties? [] Yes [x] No   Do you ever stop taking your medications because you don't believe they are helping? [] Yes [x] No     Accessory Clinical Findings   Lab Results  Component Value Date   CHOL 136 12/24/2021   HDL 35 (L) 12/24/2021   LDLCALC 73 12/24/2021   TRIG 163 (H) 12/24/2021   CHOLHDL 3.9 12/24/2021    Lab Results  Component Value Date   ALT 29 12/24/2021   AST 23 12/24/2021   ALKPHOS 59 12/24/2021   BILITOT 0.3 12/24/2021   Lab Results  Component Value Date   CREATININE 1.16 12/24/2021   BUN 13 12/24/2021   NA 143 12/24/2021   K 4.3 12/24/2021   CL 102 12/24/2021   CO2 25 12/24/2021   Lab Results  Component Value Date   HGBA1C 6.2 (H) 06/29/2019    Home Medications    Current Outpatient Medications  Medication Sig Dispense Refill   [START ON 10/25/2022] Semaglutide-Weight Management 0.5 MG/0.5ML SOAJ Inject 0.5 mg into the skin once a week for 28 days. 2 mL 0   [START ON 11/23/2022] Semaglutide-Weight Management 1 MG/0.5ML SOAJ Inject 1 mg into the skin once a week for 28 days. 2 mL 0   [START ON 12/22/2022] Semaglutide-Weight Management 1.7 MG/0.75ML SOAJ Inject 1.7 mg into the skin once a week for 28 days. 3 mL 0   [START ON 01/20/2023] Semaglutide-Weight Management 2.4 MG/0.75ML SOAJ Inject 2.4 mg into  the skin once a week. 3 mL 11   aspirin EC 81 MG tablet Take 81 mg by mouth daily.     atorvastatin (LIPITOR) 80 MG tablet Take 1 tablet (80 mg total) by mouth daily at 6 PM. 90 tablet 3   indomethacin (INDOCIN) 25 MG capsule Take 25 mg by mouth daily as needed. Gout flare-up     metoprolol succinate (TOPROL-XL) 25 MG 24 hr tablet Take 1 tablet (25 mg total) by mouth daily. 90 tablet 3   nitroGLYCERIN (NITROSTAT) 0.4 MG SL tablet Place 1 tablet (0.4 mg total) under the tongue every 5 (five) minutes as needed for up to 15 days for chest pain. 20 tablet 12   omeprazole (PRILOSEC) 40 MG capsule Take 40 mg by mouth daily as needed (reflux/heartburn).      TADALAFIL PO Take 9-18 mg by  mouth as needed (ED). (chewable tablets)     valsartan (DIOVAN) 80 MG tablet Take 1 tablet (80 mg total) by mouth daily. 90 tablet 3   Vitamin D, Ergocalciferol, (DRISDOL) 1.25 MG (50000 UT) CAPS capsule Take 50,000 Units by mouth every Sunday.      No current facility-administered medications for this visit.     Assessment & Plan    Mixed hyperlipidemia Assessment: Patient with ASCVD not at LDL goal of < 55 Most recent LDL 73 on 12/24/21 Has been compliant with high intensity statin:  atorvastatin 80  Reviewed PCSK-9 inhibitor.  Discussed mechanisms of action, dosing, side effects, potential decreases in LDL cholesterol and costs.  Also reviewed potential options for patient assistance.  Plan: Patient agreeable to starting Repatha 140 mg q14d Repeat labs after:  3 months Lipid Liver function    Obesity (BMI 30-39.9) Patient given prescription for Wegovy, still working with insurance to see if covered.  Has [employer], will not be a covered med in 2024, cost price to be ~$1,000/month.    Last A1c drawn in 2020 at 6.2.  Will repeat lab this week to determine if DM2 diagnosis, at which time we can switch to Ozempic.     Tommy Medal, PharmD CPP [facility] [address] [place]  [place], [place] [address] [phone]

## 2022-10-07 NOTE — Assessment & Plan Note (Signed)
Patient given prescription for Wegovy, still working with insurance to see if covered.  Has [employer], will not be a covered med in 2024, cost price to be ~$1,000/month.    Last A1c drawn in 2020 at 6.2.  Will repeat lab this week to determine if DM2 diagnosis, at which time we can switch to Ozempic.

## 2022-10-08 ENCOUNTER — Encounter (HOSPITAL_BASED_OUTPATIENT_CLINIC_OR_DEPARTMENT_OTHER): Payer: Self-pay

## 2022-10-13 ENCOUNTER — Encounter (HOSPITAL_BASED_OUTPATIENT_CLINIC_OR_DEPARTMENT_OTHER): Payer: Self-pay | Admitting: Pharmacist Clinician (PhC)/ Clinical Pharmacy Specialist

## 2022-10-14 NOTE — Telephone Encounter (Signed)
Await patient response to separate mychart encounter to [facility], [facility]. He is getting A1c this week and will await result. If he is diabetic switch to ozempic makes the wegovy coverage a non issue.   Will await to provide further recommendations until labs available.   Loel Dubonnet, NP

## 2022-10-14 NOTE — Telephone Encounter (Signed)
Okay to proceed with this appeal?

## 2022-10-16 LAB — HEMOGLOBIN A1C
Est. average glucose Bld gHb Est-mCnc: 140 mg/dL
Hgb A1c MFr Bld: 6.5 % — ABNORMAL HIGH (ref 4.8–5.6)

## 2022-10-16 LAB — LIPID PANEL
Chol/HDL Ratio: 4 ratio (ref 0.0–5.0)
Cholesterol, Total: 144 mg/dL (ref 100–199)
HDL: 36 mg/dL — ABNORMAL LOW (ref >39)
LDL Chol Calc (NIH): 74 mg/dL (ref 0–99)
Triglycerides: 200 mg/dL — ABNORMAL HIGH (ref 0–149)
VLDL Cholesterol Cal: 34 mg/dL (ref 5–40)

## 2022-10-20 NOTE — Telephone Encounter (Signed)
See separate MyChart encounter. Wegovy transitioned to Ozempic as his labs came back revealing diabetes.   Loel Dubonnet, NP

## 2022-10-24 MED ORDER — SEMAGLUTIDE(0.25 OR 0.5MG/DOS) 2 MG/3ML ~~LOC~~ SOPN: PEN_INJECTOR | SUBCUTANEOUS | 1 refills | Status: DC

## 2022-10-24 MED ORDER — SEMAGLUTIDE (1 MG/DOSE) 4 MG/3ML ~~LOC~~ SOPN: 1.0000 mg | PEN_INJECTOR | SUBCUTANEOUS | 1 refills | Status: DC

## 2022-10-24 MED ORDER — REPATHA SURECLICK 140 MG/ML ~~LOC~~ SOAJ: 140.0000 mg | SUBCUTANEOUS | 12 refills | Status: DC

## 2022-10-27 ENCOUNTER — Other Ambulatory Visit (HOSPITAL_COMMUNITY): Payer: Self-pay

## 2022-10-27 ENCOUNTER — Telehealth: Payer: Self-pay

## 2022-10-27 NOTE — Telephone Encounter (Signed)
Pharmacy Patient Advocate Encounter  Prior Authorization for [place] 1 MG has been approved.    Effective dates: 10/27/22 through 10/27/25  Received notification from [facility] that prior authorization for [place] 1 MG is needed.    PA submitted on 10/27/22 Key [id number] Status is pending  Karie Soda, [place] Patient Advocate Specialist Direct Number: [phone] Fax: [phone]

## 2022-10-29 ENCOUNTER — Encounter (HOSPITAL_BASED_OUTPATIENT_CLINIC_OR_DEPARTMENT_OTHER): Payer: Self-pay

## 2022-11-12 ENCOUNTER — Encounter (HOSPITAL_BASED_OUTPATIENT_CLINIC_OR_DEPARTMENT_OTHER): Payer: Self-pay

## 2022-11-19 ENCOUNTER — Encounter (HOSPITAL_BASED_OUTPATIENT_CLINIC_OR_DEPARTMENT_OTHER): Payer: Self-pay

## 2022-11-21 NOTE — Progress Notes (Signed)
Cardiology Office Note:    Date:  11/24/2022   ID:  Randall Mullen, DOB 1971-07-17, MRN [id number]  PCP:  Randall Maple, MD  Cardiologist:  Randall Dresser, MD  Referring MD: Randall Maple, MD   CC: follow up  History of Present Illness:    Randall Mullen is a 52 y.o. 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  CV history: NSTEMI, considered for CABG vs stents, recommended for stents and received 06/2019. Sister and dad had fatty liver. After shared decision making we changed to aspirin alone starting after 06/29/20. We changed to metoprolol succinate 25 mg daily for ease of administration.   At his last appointment he complained of infrequent chest tightness that was distinct from the pain he felt prior to his stents. At home his blood pressure was more elevated at 150-155/80-85. He was frustrated with not being able to lose weight despite routine exercise and a good diet. Occasionally he took a diuretic to sweat more with exercise. He had difficulty with obtaining his initial doses of Wegovy due to limited supply. Additionally he was concerned about low testosterone. We discussed the known affects of high testosterone on the heart and advised him to follow-up with his PCP.  He followed up with our pharmacist 10/07/2022 and was amenable to starting Repatha 140 mg. Subsequent lab work was consistent with diabetes. Randall Mullen was transitioned to Ozempic. He reported taking the initial doses of both Repatha and Ozempic on 10/27/22.  Today, he is feeling good. He is down 21 lbs since his peak of 308 lbs. Today will be his 5th dosing of his Ozempic 0.25 mg. His next injection will be 0.5 mg. He has had about 3-4 doses of Repatha. However, he believes that he is developing side effects from [name]. For 4 weeks now he has noticed a dry cough and significant back pain, especially with sitting and  standing. His back pain is localized to his right lower back, almost to his hip. While walking on the treadmill his back pain will improve. He denies any prior injuries to his back. There is a palpable muscle knot on exam.  Also he has noticed a decrease in his appetite. He endorses a side effect of gas from his medications after eating. No irregular bowel movements.  For exercise he is completing 45 minutes of cardio in the mornings. He then returns to the gym in the evenings for weight training.  He denies any palpitations, chest pain, shortness of breath, or peripheral edema. No lightheadedness, headaches, syncope, orthopnea, or PND.   Past Medical History:  Diagnosis Date   GERD (gastroesophageal reflux disease)    Gout    HLD (hyperlipidemia)     Past Surgical History:  Procedure Laterality Date   CORONARY STENT INTERVENTION N/A 06/30/2019   Procedure: CORONARY STENT INTERVENTION;  Surgeon: Burnell Blanks, MD;  Location: [place] [facility];  Service: Cardiovascular;  Laterality: N/A;   LEFT HEART CATH AND CORONARY ANGIOGRAPHY N/A 06/29/2019   Procedure: LEFT HEART CATH AND CORONARY ANGIOGRAPHY;  Surgeon: Burnell Blanks, MD;  Location: [place] [facility];  Service: Cardiovascular;  Laterality: N/A;   VENTRAL HERNIA REPAIR      Current Medications: Current Outpatient Medications on File Prior to Visit  Medication Sig   aspirin EC 81 MG tablet Take 81 mg by mouth daily.   atorvastatin (LIPITOR) 80 MG tablet Take 1 tablet (  80 mg total) by mouth daily at 6 PM.   Evolocumab (REPATHA SURECLICK) [id number] MG/ML SOAJ Inject 140 mg into the skin every 14 (fourteen) days.   indomethacin (INDOCIN) 25 MG capsule Take 25 mg by mouth daily as needed. Gout flare-up   metoprolol succinate (TOPROL-XL) 25 MG 24 hr tablet Take 1 tablet (25 mg total) by mouth daily.   omeprazole (PRILOSEC) 40 MG capsule Take 40 mg by mouth daily as needed (reflux/heartburn).    Semaglutide, 1 MG/DOSE, 4  MG/3ML SOPN Inject 1 mg into the skin once a week.   Semaglutide,0.25 or 0.'5MG'$ /DOS, 2 MG/3ML SOPN Inject 0.25 mg once weekly for 4 weeks, then increase to 0.5 mg weekly for 6 weeks total   TADALAFIL PO Take 9-18 mg by mouth as needed (ED). (chewable tablets)   valsartan (DIOVAN) 80 MG tablet Take 1 tablet (80 mg total) by mouth daily.   Vitamin D, Ergocalciferol, (DRISDOL) 1.25 MG (50000 UT) CAPS capsule Take 50,000 Units by mouth every Sunday.    nitroGLYCERIN (NITROSTAT) 0.4 MG SL tablet Place 1 tablet (0.4 mg total) under the tongue every 5 (five) minutes as needed for up to 15 days for chest pain.   No current facility-administered medications on file prior to visit.     Allergies:   Patient has no known allergies.   Social History   Tobacco Use   Smoking status: Never   Smokeless tobacco: Never  Substance Use Topics   Alcohol use: Yes   Drug use: Never    Family History: family history includes CAD in his father; Heart attack in his paternal grandmother.  ROS:   Please see the history of present illness.   (+) Dry cough (+) Back pain Additional pertinent ROS otherwise unremarkable.  EKGs/Labs/Other Studies Reviewed:    The following studies were reviewed today:  Echo  08/17/2019:  1. Left ventricular ejection fraction, by visual estimation, is 60 to  65%. The left ventricle has normal function. There is mildly increased  left ventricular hypertrophy.   2. Global right ventricle has normal systolic function.The right  ventricular size is mildly enlarged.   3. Left atrial size was normal.   4. Right atrial size was normal.   5. The mitral valve is normal in structure. No evidence of mitral valve  regurgitation.   6. The tricuspid valve is normal in structure. Tricuspid valve  regurgitation is not demonstrated.   7. The aortic valve is tricuspid. Aortic valve regurgitation is not  visualized.   8. The pulmonic valve was not well visualized. Pulmonic valve   regurgitation not assessed.   9. TR signal is inadequate for assessing pulmonary artery systolic  pressure.   PCI 06/30/19 Prox LAD lesion is 99% stenosed. Ost Cx to Prox Cx lesion is 50% stenosed. Mid Cx to Dist Cx lesion is 100% stenosed. Prox RCA lesion is 80% stenosed. RPAV lesion is 99% stenosed. A drug-eluting stent was successfully placed using a STENT RESOLUTE ONYX 3.5X26. Post intervention, there is a 0% residual stenosis. A drug-eluting stent was successfully placed using a STENT RESOLUTE ONYX 2.25X30. Post intervention, there is a 0% residual stenosis. A drug-eluting stent was successfully placed using a STENT RESOLUTE ONYX 4.0X18. Post intervention, there is a 0% residual stenosis.   1. Successful PTCA/DES x 1 proximal LAD 2. Successful PTCA/DES x 1 posterolateral artery 3. Successful PTCA/DES x 1 proximal RCA   Will continue DAPT with ASA and Brilinta for one year. Continue statin and beta blocker. Likely  d/c home tomorrow after hydration post dye load. BMET in am.    Echo 06/29/19  1. Left ventricular ejection fraction, by visual estimation, is 55 to 60%. The left ventricle has normal function. Normal left ventricular size. There is mildly increased left ventricular hypertrophy.  2. Impaired diastolic annular velocities for age, early diastolic dysfunction.  3. Basal inferolateral segment and basal inferior segment are hypokinetic.  4. Global right ventricle has normal systolic function.The right ventricular size is normal. No increase in right ventricular wall thickness.  5. Left atrial size was moderately dilated.  6. Right atrial size was mildly dilated.  7. The mitral valve is normal in structure. No evidence of mitral valve regurgitation. No evidence of mitral stenosis.  8. The tricuspid valve is normal in structure. Tricuspid valve regurgitation was not visualized by color flow Doppler.  9. The aortic valve is normal in structure. Aortic valve regurgitation was  not visualized by color flow Doppler. Structurally normal aortic valve, with no evidence of sclerosis or stenosis. 10. The pulmonic valve was normal in structure. Pulmonic valve regurgitation is not visualized by color flow Doppler. 11. Normal pulmonary artery systolic pressure. 12. The inferior vena cava is normal in size with greater than 50% respiratory variability, suggesting right atrial pressure of 3 mmHg.   Cath 06/29/19 Prox RCA lesion is 80% stenosed. RPAV lesion is 99% stenosed. Ost Cx to Prox Cx lesion is 50% stenosed. Prox LAD lesion is 99% stenosed. Mid Cx to Dist Cx lesion is 100% stenosed.   1. Severe three vessel CAD 2. Severe stenosis proximal LAD. The mid and distal LAD fills briskly from antegrade flow but also seen to fill from faint right to left collaterals.  3. Moderate disease in the proximal Circumflex artery with chronic occlusion of the mid Circumflex just after the takeoff of the large obtuse marginal branch 4. Severe focal stenosis in the proximal segment of the large dominant RCA. Severe stenosis in the the proximal segment of the moderate caliber posterolateral artery 5. Normal filling pressures.    Recommendations: Options for treatment of multi-vessel CAD include CABG vs multi-vessel stenting. Given his young age and focal nature of the disease in his RCA and LAD, I feel that stenting is the best long term option for treatment.  I have reviewed with my interventional colleagues this am and they agree. I have discussed this with the patient and his wife and they agree. Will load with Brilinta 180 mg po x 1 today. Plan PCI of the RCA and LAD tomorrow.    Lexiscan 06/28/19 ([place]) Moderate area of moderately decreased activity in inferolateral wall near base, reversible. Fixed small defect in anteroseptal region. EF 37%  EKG:  EKG is personally reviewed.   11/24/2022:  EKG was not ordered. 09/24/2022:  NSR at 82 bpm wih nonspecific ST pattern 10/20/19: NSR with  anterolateral TWI  Recent Labs: 12/24/2021: ALT 29 11/21/2022: BUN 14; Creatinine, Ser 1.34; Potassium 4.6; Sodium 141   Recent Lipid Panel    Component Value Date/Time   CHOL 144 10/15/2022 1557   TRIG 200 (H) 10/15/2022 1557   HDL 36 (L) 10/15/2022 1557   CHOLHDL 4.0 10/15/2022 1557   LDLCALC 74 10/15/2022 1557     Physical Exam:    VS:  BP 126/82   Pulse 87   Ht '6\' 3"'$  (1.905 m)   Wt 287 lb (130.2 kg)   BMI 35.87 kg/m     Wt Readings from Last 3 Encounters:  11/24/22 287  lb (130.2 kg)  10/07/22 (!) 303 lb 9.2 oz (137.7 kg)  09/24/22 (!) 308 lb 6.4 oz (139.9 kg)    GEN: Well nourished, well developed in no acute distress HEENT: Normal, moist mucous membranes NECK: No JVD CARDIAC: regular rhythm, normal S1 and S2, no rubs or gallops. No murmur. VASCULAR: Radial and DP pulses 2+ bilaterally. No carotid bruits RESPIRATORY:  Clear to auscultation without rales, wheezing or rhonchi  ABDOMEN: Soft, non-tender, non-distended MUSCULOSKELETAL:  Ambulates independently. Palpable muscle knot of right lower back. SKIN: Warm and dry, no edema NEUROLOGIC:  Alert and oriented x 3. No focal neuro deficits noted. PSYCHIATRIC:  Normal affect   ASSESSMENT:    1. Coronary artery disease of native artery of native heart with stable angina pectoris ([place])   2. History of coronary angioplasty with insertion of stent   3. Mixed hyperlipidemia   4. Obesity (BMI 30-39.9)   5. Family history of heart disease   6. Essential hypertension   7. Type 2 diabetes mellitus without complication, without long-term current use of insulin (HCC)    PLAN:    3V CAD s/p PCI 06/30/2019. -was on DAPT for one year, now on aspirin 81 mg daily since 2021 -continue atorvastatin 80 mg daily, repatha. -counseled on diet, exercise. Doing excellent with this. -discussed PDE5 inhibitors and risk with SL NG. Ok to use PDEi but need to alert any EMS/ER to use if he has chest pain to alert them to life threatening  interaction with nitrate. -normal EF -continue metoprolol succinate 25 mg daily for ease of administration. Feels well on this  Mixed hyperlipidemia -normal lp(a) -continue atorvastatin 80 mg daily, PCSK9i  Obesity Type II diabetes, last A1c 6.5 -BMI down to 35 -doing excellent with semaglutide -I think GLP1 is an excellent choice for him given his CAD  Hypertension: continue valsartan  Additional CV risk: Family history: father with 6 caths, multiple family members with MI in late 40s/50s  Cardiac risk counseling and prevention recommendations: -recommend heart healthy/Mediterranean diet, with whole grains, fruits, vegetable, fish, lean meats, nuts, and olive oil. Limit salt. -recommend moderate walking, 3-5 times/week for 30-50 minutes each session. Aim for at least 150 minutes.week. Goal should be pace of 3 miles/hours, or walking 1.5 miles in 30 minutes -recommend avoidance of tobacco products. Avoid excess alcohol.  Plan for follow up: 3 months or sooner as needed.   Randall Dresser, MD, PhD, [facility]    Medication Adjustments/Labs and Tests Ordered: Current medicines are reviewed at length with the patient today.  Concerns regarding medicines are outlined above.   No orders of the defined types were placed in this encounter.  No orders of the defined types were placed in this encounter.  Patient Instructions  Medication Instructions:  Your physician recommends that you continue on your current medications as directed. Please refer to the Current Medication list given to you today.  *If you need a refill on your cardiac medications before your next appointment, please call your pharmacy*  Lab Work: NONE  Testing/Procedures: NONE  Follow-Up: At [facility], you and your health needs are our priority.  As part of our continuing mission to provide you with exceptional heart care, we have created designated Provider Care  Teams.  These Care Teams include your primary Cardiologist (physician) and Advanced Practice Providers (APPs -  Physician Assistants and Nurse Practitioners) who all work together to provide you with the care you need, when you need it.  We recommend signing up for the patient portal called "MyChart".  Sign up information is provided on this After Visit Summary.  MyChart is used to connect with patients for Virtual Visits (Telemedicine).  Patients are able to view lab/test results, encounter notes, upcoming appointments, etc.  Non-urgent messages can be sent to your provider as well.   To learn more about what you can do with MyChart, go to [email or web address].    Your next appointment:   3 month(s)  The format for your next appointment:   In Person  Provider:   Buford Dresser, MD       [facility] Stumpf,acting as a scribe for Randall Dresser, MD.,have documented all relevant documentation on the behalf of Randall Dresser, MD,as directed by  Randall Dresser, MD while in the presence of Randall Dresser, MD.  I, Randall Dresser, MD, have reviewed all documentation for this visit. The documentation on 11/24/22 for the exam, diagnosis, procedures, and orders are all accurate and complete.   Signed, Randall Dresser, MD PhD 11/24/2022     [place]

## 2022-11-22 LAB — BASIC METABOLIC PANEL
BUN/Creatinine Ratio: 10 (ref 9–20)
BUN: 14 mg/dL (ref 6–24)
CO2: 23 mmol/L (ref 20–29)
Calcium: 9.3 mg/dL (ref 8.7–10.2)
Chloride: 99 mmol/L (ref 96–106)
Creatinine, Ser: 1.34 mg/dL — ABNORMAL HIGH (ref 0.76–1.27)
Glucose: 85 mg/dL (ref 70–99)
Potassium: 4.6 mmol/L (ref 3.5–5.2)
Sodium: 141 mmol/L (ref 134–144)
eGFR: 65 mL/min/{1.73_m2} (ref >59)

## 2022-11-24 ENCOUNTER — Ambulatory Visit (HOSPITAL_BASED_OUTPATIENT_CLINIC_OR_DEPARTMENT_OTHER): Payer: BC Managed Care – PPO | Admitting: Cardiology

## 2022-11-24 ENCOUNTER — Encounter (HOSPITAL_BASED_OUTPATIENT_CLINIC_OR_DEPARTMENT_OTHER): Payer: Self-pay | Admitting: Cardiology

## 2022-11-24 VITALS — BP 126/82 | HR 87 | Ht 75.0 in | Wt 287.0 lb

## 2022-11-24 DIAGNOSIS — E669 Obesity, unspecified: Secondary | ICD-10-CM | POA: Diagnosis not present

## 2022-11-24 DIAGNOSIS — Z955 Presence of coronary angioplasty implant and graft: Secondary | ICD-10-CM | POA: Diagnosis not present

## 2022-11-24 DIAGNOSIS — I25118 Atherosclerotic heart disease of native coronary artery with other forms of angina pectoris: Secondary | ICD-10-CM

## 2022-11-24 DIAGNOSIS — E119 Type 2 diabetes mellitus without complications: Secondary | ICD-10-CM

## 2022-11-24 DIAGNOSIS — E782 Mixed hyperlipidemia: Secondary | ICD-10-CM | POA: Diagnosis not present

## 2022-11-24 DIAGNOSIS — I10 Essential (primary) hypertension: Secondary | ICD-10-CM

## 2022-11-24 DIAGNOSIS — Z8249 Family history of ischemic heart disease and other diseases of the circulatory system: Secondary | ICD-10-CM

## 2022-11-24 NOTE — Patient Instructions (Signed)
Medication Instructions:  Your physician recommends that you continue on your current medications as directed. Please refer to the Current Medication list given to you today.   *If you need a refill on your cardiac medications before your next appointment, please call your pharmacy*  Lab Work: NONE   Testing/Procedures: NONE   Follow-Up: At [facility], you and your health needs are our priority.  As part of our continuing mission to provide you with exceptional heart care, we have created designated Provider Care Teams.  These Care Teams include your primary Cardiologist (physician) and Advanced Practice Providers (APPs -  Physician Assistants and Nurse Practitioners) who all work together to provide you with the care you need, when you need it.  We recommend signing up for the patient portal called "MyChart".  Sign up information is provided on this After Visit Summary.  MyChart is used to connect with patients for Virtual Visits (Telemedicine).  Patients are able to view lab/test results, encounter notes, upcoming appointments, etc.  Non-urgent messages can be sent to your provider as well.   To learn more about what you can do with MyChart, go to [email or web address].    Your next appointment:   3 month(s)  The format for your next appointment:   In Person  Provider:   Bridgette Christopher, MD              

## 2022-12-07 ENCOUNTER — Encounter (HOSPITAL_BASED_OUTPATIENT_CLINIC_OR_DEPARTMENT_OTHER): Payer: Self-pay | Admitting: Cardiology

## 2022-12-16 ENCOUNTER — Other Ambulatory Visit (HOSPITAL_BASED_OUTPATIENT_CLINIC_OR_DEPARTMENT_OTHER): Payer: Self-pay | Admitting: Cardiology

## 2022-12-16 NOTE — Telephone Encounter (Signed)
Please advise on what dose pt should be taking. Thank you!

## 2022-12-16 NOTE — Telephone Encounter (Signed)
Please review for refill. Thank you! 

## 2022-12-16 NOTE — Telephone Encounter (Signed)
Please assist with refill.  Thank you. 

## 2022-12-20 ENCOUNTER — Other Ambulatory Visit (HOSPITAL_BASED_OUTPATIENT_CLINIC_OR_DEPARTMENT_OTHER): Payer: Self-pay

## 2022-12-22 ENCOUNTER — Other Ambulatory Visit: Payer: Self-pay | Admitting: Pharmacist Clinician (PhC)/ Clinical Pharmacy Specialist

## 2022-12-22 ENCOUNTER — Other Ambulatory Visit (HOSPITAL_BASED_OUTPATIENT_CLINIC_OR_DEPARTMENT_OTHER): Payer: Self-pay

## 2022-12-22 DIAGNOSIS — I10 Essential (primary) hypertension: Secondary | ICD-10-CM

## 2022-12-22 MED ORDER — OZEMPIC (2 MG/DOSE) 8 MG/3ML ~~LOC~~ SOPN: 2.0000 mg | PEN_INJECTOR | SUBCUTANEOUS | 1 refills | Status: DC

## 2022-12-22 MED ORDER — SEMAGLUTIDE (1 MG/DOSE) 4 MG/3ML ~~LOC~~ SOPN: 1.0000 mg | PEN_INJECTOR | SUBCUTANEOUS | 0 refills | Status: DC

## 2022-12-22 MED ORDER — VALSARTAN 80 MG PO TABS: 80.0000 mg | ORAL_TABLET | Freq: Every day | ORAL | 3 refills | Status: DC

## 2022-12-22 NOTE — Telephone Encounter (Signed)
Spoke with patient - he is on week 4 of the 0.5 mg (has 2 doses left), then will go to 1 mg dose.  Tolerating well.  Also would like refill on valsartan sent to same pharmacy in [place] ([facility])

## 2022-12-22 NOTE — Telephone Encounter (Signed)
Please see message below from Dr. Cristal Deer. Thank you!

## 2022-12-22 NOTE — Telephone Encounter (Signed)
Patient states he is returning a call regarding his proscription.

## 2023-01-16 ENCOUNTER — Other Ambulatory Visit (HOSPITAL_BASED_OUTPATIENT_CLINIC_OR_DEPARTMENT_OTHER): Payer: Self-pay | Admitting: Family

## 2023-01-16 DIAGNOSIS — I25118 Atherosclerotic heart disease of native coronary artery with other forms of angina pectoris: Secondary | ICD-10-CM

## 2023-01-16 NOTE — Telephone Encounter (Signed)
Rx request sent to pharmacy.  

## 2023-02-16 ENCOUNTER — Encounter (HOSPITAL_BASED_OUTPATIENT_CLINIC_OR_DEPARTMENT_OTHER): Payer: Self-pay | Admitting: Cardiology

## 2023-02-16 ENCOUNTER — Ambulatory Visit (HOSPITAL_BASED_OUTPATIENT_CLINIC_OR_DEPARTMENT_OTHER): Payer: BC Managed Care – PPO | Admitting: Cardiology

## 2023-02-16 VITALS — BP 112/78 | HR 76 | Ht 75.0 in | Wt 264.0 lb

## 2023-02-16 DIAGNOSIS — I2510 Atherosclerotic heart disease of native coronary artery without angina pectoris: Secondary | ICD-10-CM

## 2023-02-16 DIAGNOSIS — E669 Obesity, unspecified: Secondary | ICD-10-CM | POA: Diagnosis not present

## 2023-02-16 DIAGNOSIS — E782 Mixed hyperlipidemia: Secondary | ICD-10-CM | POA: Diagnosis not present

## 2023-02-16 DIAGNOSIS — E119 Type 2 diabetes mellitus without complications: Secondary | ICD-10-CM

## 2023-02-16 DIAGNOSIS — Z8249 Family history of ischemic heart disease and other diseases of the circulatory system: Secondary | ICD-10-CM

## 2023-02-16 DIAGNOSIS — Z955 Presence of coronary angioplasty implant and graft: Secondary | ICD-10-CM

## 2023-02-16 DIAGNOSIS — Z7985 Long-term (current) use of injectable non-insulin antidiabetic drugs: Secondary | ICD-10-CM

## 2023-02-16 DIAGNOSIS — I10 Essential (primary) hypertension: Secondary | ICD-10-CM

## 2023-02-16 NOTE — Progress Notes (Signed)
Cardiology Office Note:    Date:  02/16/2023   ID:  Randall Mullen, DOB [birth year], MRN [id number]  PCP:  Randall Stall, MD  Cardiologist:  Jodelle Red, MD  Referring MD: Randall Stall, MD   CC: follow up  History of Present Illness:    Randall Mullen is a 52 y.o. 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  CV history: NSTEMI, considered for CABG vs stents, recommended for stents and received 06/2019. Sister and dad had fatty liver. After shared decision making we changed to aspirin alone starting after 06/29/20. We changed to metoprolol succinate 25 mg daily for ease of administration.   Today, his weight is down to 264 lbs in the office. He has lost 44 lbs from his peak of 308 lbs and is feeling good. For a period of 2 weeks his weight temporarily plateaued around 276-278 lbs before he started losing weight again. Currently he is on the 1 mg dose for a few more weeks before increasing to 2 mg. He has noticed decreased waist and neck sizes. His sleep quality has improved and he is no longer snoring.  He does complain of sudden lightheadedness with standing up quickly. He believes this is a side effect of the Ozempic. Of note, this lightheadedness doesn't affect him during his weight lifting or cardio exercises. When he is motivated he completes cardio exercises in the mornings 7 days a week, and he lifts weights in the afternoons 6 days a week.  Lately his blood pressures have been more controlled. In clinic today his BP is 112/78. He denies any chest pain. He remains compliant with all of his medications.  He denies any palpitations, chest pain, shortness of breath, peripheral edema, headaches, syncope, orthopnea, or PND.   Past Medical History:  Diagnosis Date   GERD (gastroesophageal reflux disease)    Gout    HLD (hyperlipidemia)     Past Surgical History:   Procedure Laterality Date   CORONARY STENT INTERVENTION N/A 06/30/2019   Procedure: CORONARY STENT INTERVENTION;  Surgeon: Kathleene Hazel, MD;  Location: [facility];  Service: Cardiovascular;  Laterality: N/A;   LEFT HEART CATH AND CORONARY ANGIOGRAPHY N/A 06/29/2019   Procedure: LEFT HEART CATH AND CORONARY ANGIOGRAPHY;  Surgeon: Kathleene Hazel, MD;  Location: [facility];  Service: Cardiovascular;  Laterality: N/A;   VENTRAL HERNIA REPAIR      Current Medications: Current Outpatient Medications on File Prior to Visit  Medication Sig   aspirin EC 81 MG tablet Take 81 mg by mouth daily.   atorvastatin (LIPITOR) 80 MG tablet Take 1 tablet (80 mg total) by mouth daily at 6 PM.   Evolocumab (REPATHA SURECLICK) 140 MG/ML SOAJ Inject 140 mg into the skin every 14 (fourteen) days.   indomethacin (INDOCIN) 25 MG capsule Take 25 mg by mouth daily as needed. Gout flare-up   metoprolol succinate (TOPROL-XL) 25 MG 24 hr tablet TAKE 1 TABLET (25 MG TOTAL) BY MOUTH DAILY.   omeprazole (PRILOSEC) 40 MG capsule Take 40 mg by mouth daily as needed (reflux/heartburn).    Semaglutide, 1 MG/DOSE, 4 MG/3ML SOPN Inject 1 mg into the skin once a week.   Semaglutide, 1 MG/DOSE, 4 MG/3ML SOPN Inject 1 mg into the skin once a week.   Semaglutide, 2 MG/DOSE, (OZEMPIC, 2 MG/DOSE,) 8 MG/3ML SOPN Inject 2 mg into the skin every 7 (seven) days.  TADALAFIL PO Take 9-18 mg by mouth as needed (ED). (chewable tablets)   valsartan (DIOVAN) 80 MG tablet Take 1 tablet (80 mg total) by mouth daily.   Vitamin D, Ergocalciferol, (DRISDOL) 1.25 MG (50000 UT) CAPS capsule Take 50,000 Units by mouth every Sunday.    nitroGLYCERIN (NITROSTAT) 0.4 MG SL tablet Place 1 tablet (0.4 mg total) under the tongue every 5 (five) minutes as needed for up to 15 days for chest pain.   No current facility-administered medications on file prior to visit.     Allergies:   Patient has no known allergies.    Social History   Tobacco Use   Smoking status: Never   Smokeless tobacco: Never  Substance Use Topics   Alcohol use: Yes   Drug use: Never    Family History: family history includes CAD in his father; Heart attack in his paternal grandmother.  ROS:   Please see the history of present illness.   (+) Positional lightheadedness Additional pertinent ROS otherwise unremarkable.  EKGs/Labs/Other Studies Reviewed:    The following studies were reviewed today:  Echo  08/17/2019:  1. Left ventricular ejection fraction, by visual estimation, is 60 to  65%. The left ventricle has normal function. There is mildly increased  left ventricular hypertrophy.   2. Global right ventricle has normal systolic function.The right  ventricular size is mildly enlarged.   3. Left atrial size was normal.   4. Right atrial size was normal.   5. The mitral valve is normal in structure. No evidence of mitral valve  regurgitation.   6. The tricuspid valve is normal in structure. Tricuspid valve  regurgitation is not demonstrated.   7. The aortic valve is tricuspid. Aortic valve regurgitation is not  visualized.   8. The pulmonic valve was not well visualized. Pulmonic valve  regurgitation not assessed.   9. TR signal is inadequate for assessing pulmonary artery systolic  pressure.   PCI 06/30/19 Prox LAD lesion is 99% stenosed. Ost Cx to Prox Cx lesion is 50% stenosed. Mid Cx to Dist Cx lesion is 100% stenosed. Prox RCA lesion is 80% stenosed. RPAV lesion is 99% stenosed. A drug-eluting stent was successfully placed using a STENT RESOLUTE ONYX 3.5X26. Post intervention, there is a 0% residual stenosis. A drug-eluting stent was successfully placed using a STENT RESOLUTE ONYX 2.25X30. Post intervention, there is a 0% residual stenosis. A drug-eluting stent was successfully placed using a STENT RESOLUTE ONYX 4.0X18. Post intervention, there is a 0% residual stenosis.   1. Successful PTCA/DES x  1 proximal LAD 2. Successful PTCA/DES x 1 posterolateral artery 3. Successful PTCA/DES x 1 proximal RCA   Will continue DAPT with ASA and Brilinta for one year. Continue statin and beta blocker. Likely d/c home tomorrow after hydration post dye load. BMET in am.    Echo 06/29/19  1. Left ventricular ejection fraction, by visual estimation, is 55 to 60%. The left ventricle has normal function. Normal left ventricular size. There is mildly increased left ventricular hypertrophy.  2. Impaired diastolic annular velocities for age, early diastolic dysfunction.  3. Basal inferolateral segment and basal inferior segment are hypokinetic.  4. Global right ventricle has normal systolic function.The right ventricular size is normal. No increase in right ventricular wall thickness.  5. Left atrial size was moderately dilated.  6. Right atrial size was mildly dilated.  7. The mitral valve is normal in structure. No evidence of mitral valve regurgitation. No evidence of mitral stenosis.  8. The  tricuspid valve is normal in structure. Tricuspid valve regurgitation was not visualized by color flow Doppler.  9. The aortic valve is normal in structure. Aortic valve regurgitation was not visualized by color flow Doppler. Structurally normal aortic valve, with no evidence of sclerosis or stenosis. 10. The pulmonic valve was normal in structure. Pulmonic valve regurgitation is not visualized by color flow Doppler. 11. Normal pulmonary artery systolic pressure. 12. The inferior vena cava is normal in size with greater than 50% respiratory variability, suggesting right atrial pressure of 3 mmHg.   Cath 06/29/19 Prox RCA lesion is 80% stenosed. RPAV lesion is 99% stenosed. Ost Cx to Prox Cx lesion is 50% stenosed. Prox LAD lesion is 99% stenosed. Mid Cx to Dist Cx lesion is 100% stenosed.   1. Severe three vessel CAD 2. Severe stenosis proximal LAD. The mid and distal LAD fills briskly from antegrade flow but  also seen to fill from faint right to left collaterals.  3. Moderate disease in the proximal Circumflex artery with chronic occlusion of the mid Circumflex just after the takeoff of the large obtuse marginal branch 4. Severe focal stenosis in the proximal segment of the large dominant RCA. Severe stenosis in the the proximal segment of the moderate caliber posterolateral artery 5. Normal filling pressures.    Recommendations: Options for treatment of multi-vessel CAD include CABG vs multi-vessel stenting. Given his young age and focal nature of the disease in his RCA and LAD, I feel that stenting is the best long term option for treatment.  I have reviewed with my interventional colleagues this am and they agree. I have discussed this with the patient and his wife and they agree. Will load with Brilinta 180 mg po x 1 today. Plan PCI of the RCA and LAD tomorrow.    Lexiscan 06/28/19 ([place]) Moderate area of moderately decreased activity in inferolateral wall near base, reversible. Fixed small defect in anteroseptal region. EF 37%  EKG:  EKG is personally reviewed.   02/16/2023:  Not ordered. 11/24/2022:  EKG was not ordered. 09/24/2022:  NSR at 82 bpm wih nonspecific ST pattern 10/20/19: NSR with anterolateral TWI  Recent Labs: 11/21/2022: BUN 14; Creatinine, Ser 1.34; Potassium 4.6; Sodium 141   Recent Lipid Panel    Component Value Date/Time   CHOL 144 10/15/2022 1557   TRIG 200 (H) 10/15/2022 1557   HDL 36 (L) 10/15/2022 1557   CHOLHDL 4.0 10/15/2022 1557   LDLCALC 74 10/15/2022 1557     Physical Exam:    VS:  BP 112/78   Pulse 76   Ht 6\' 3"  (1.905 m)   Wt 264 lb (119.7 kg)   BMI 33.00 kg/m     Wt Readings from Last 3 Encounters:  02/16/23 264 lb (119.7 kg)  11/24/22 287 lb (130.2 kg)  10/07/22 (!) 303 lb 9.2 oz (137.7 kg)    GEN: Well nourished, well developed in no acute distress HEENT: Normal, moist mucous membranes NECK: No JVD CARDIAC: regular rhythm, normal S1 and S2,  no rubs or gallops. No murmur. VASCULAR: Radial and DP pulses 2+ bilaterally. No carotid bruits RESPIRATORY:  Clear to auscultation without rales, wheezing or rhonchi  ABDOMEN: Soft, non-tender, non-distended MUSCULOSKELETAL:  Ambulates independently.  SKIN: Warm and dry, no edema NEUROLOGIC:  Alert and oriented x 3. No focal neuro deficits noted. PSYCHIATRIC:  Normal affect   ASSESSMENT:    1. Coronary artery disease involving native coronary artery of native heart without angina pectoris   2. Mixed  hyperlipidemia   3. History of coronary angioplasty with insertion of stent   4. Obesity (BMI 30-39.9)   5. Family history of heart disease   6. Essential hypertension   7. Type 2 diabetes mellitus without complication, without long-term current use of insulin (HCC)     PLAN:    3V CAD s/p PCI 06/30/2019. -was on DAPT for one year, now on aspirin 81 mg daily since 2021 -continue atorvastatin 80 mg daily, repatha. -counseled on diet, exercise. Doing excellent with this. -discussed PDE5 inhibitors and risk with SL NG. Ok to use PDEi but need to alert any EMS/ER to use if he has chest pain to alert them to life threatening interaction with nitrate. -normal EF -continue metoprolol succinate 25 mg daily, discussed stopping today. No angina. Discuss again at follow up  Mixed hyperlipidemia -normal lp(a) -continue atorvastatin 80 mg daily, PCSK9i -recheck lipids due to start of PCSK9i (6 mos) and weight loss  Obesity Type II diabetes, prior A1c 6.5 -BMI down to 33 -doing excellent with semaglutide, peak weight 308 lbs, current 264 lbs -recommend he increase to 2 mg weekly dose, then if he is near his goal weight (240 lbs), drop back to 1 mg weekly dose.   Hypertension: continue valsartan, but if BP continues to improve may be able to cut back or drop at follow up  Additional CV risk: Family history: father with 6 caths, multiple family members with MI in late 40s/50s  Cardiac risk  counseling and prevention recommendations: -recommend heart healthy/Mediterranean diet, with whole grains, fruits, vegetable, fish, lean meats, nuts, and olive oil. Limit salt. -recommend moderate walking, 3-5 times/week for 30-50 minutes each session. Aim for at least 150 minutes.week. Goal should be pace of 3 miles/hours, or walking 1.5 miles in 30 minutes -recommend avoidance of tobacco products. Avoid excess alcohol.  Plan for follow up: 3 months or sooner as needed.   Jodelle Red, MD, PhD, [facility] [place]  [facility]    Medication Adjustments/Labs and Tests Ordered: Current medicines are reviewed at length with the patient today.  Concerns regarding medicines are outlined above.   Orders Placed This Encounter  Procedures   Lipid panel   No orders of the defined types were placed in this encounter.  Patient Instructions  Medication Instructions:  Your physician recommends that you continue on your current medications as directed. Please refer to the Current Medication list given to you today.  *If you need a refill on your cardiac medications before your next appointment, please call your pharmacy*  Lab Work: LIPID PANEL NEXT MONTH   If you have labs (blood work) drawn today and your tests are completely normal, you will receive your results only by: MyChart Message (if you have MyChart) OR A paper copy in the mail If you have any lab test that is abnormal or we need to change your treatment, we will call you to review the results.  Testing/Procedures: NONE  Follow-Up: At [facility], you and your health needs are our priority.  As part of our continuing mission to provide you with exceptional heart care, we have created designated Provider Care Teams.  These Care Teams include your primary Cardiologist (physician) and Advanced Practice Providers (APPs -  Physician Assistants and Nurse Practitioners) who all work together to provide you with the care  you need, when you need it.  We recommend signing up for the patient portal called "MyChart".  Sign up information is provided on this After Visit  Summary.  MyChart is used to connect with patients for Virtual Visits (Telemedicine).  Patients are able to view lab/test results, encounter notes, upcoming appointments, etc.  Non-urgent messages can be sent to your provider as well.   To learn more about what you can do with MyChart, go to [email or web address].    Your next appointment:   3 month(s)  Provider:   Jodelle Red, MD or Gillian Shields, NP        [facility] Stumpf,acting as a scribe for Jodelle Red, MD.,have documented all relevant documentation on the behalf of Jodelle Red, MD,as directed by  Jodelle Red, MD while in the presence of Jodelle Red, MD.  I, Jodelle Red, MD, have reviewed all documentation for this visit. The documentation on 02/16/23 for the exam, diagnosis, procedures, and orders are all accurate and complete.   Signed, Jodelle Red, MD PhD 02/16/2023     [facility]

## 2023-02-16 NOTE — Patient Instructions (Signed)
Medication Instructions:  Your physician recommends that you continue on your current medications as directed. Please refer to the Current Medication list given to you today.  *If you need a refill on your cardiac medications before your next appointment, please call your pharmacy*  Lab Work: LIPID PANEL NEXT MONTH   If you have labs (blood work) drawn today and your tests are completely normal, you will receive your results only by: MyChart Message (if you have MyChart) OR A paper copy in the mail If you have any lab test that is abnormal or we need to change your treatment, we will call you to review the results.  Testing/Procedures: NONE  Follow-Up: At [facility], you and your health needs are our priority.  As part of our continuing mission to provide you with exceptional heart care, we have created designated Provider Care Teams.  These Care Teams include your primary Cardiologist (physician) and Advanced Practice Providers (APPs -  Physician Assistants and Nurse Practitioners) who all work together to provide you with the care you need, when you need it.  We recommend signing up for the patient portal called "MyChart".  Sign up information is provided on this After Visit Summary.  MyChart is used to connect with patients for Virtual Visits (Telemedicine).  Patients are able to view lab/test results, encounter notes, upcoming appointments, etc.  Non-urgent messages can be sent to your provider as well.   To learn more about what you can do with MyChart, go to [email or web address].    Your next appointment:   3 month(s)  Provider:   Jodelle Red, MD or Gillian Shields, NP

## 2023-02-25 ENCOUNTER — Other Ambulatory Visit (HOSPITAL_BASED_OUTPATIENT_CLINIC_OR_DEPARTMENT_OTHER): Payer: Self-pay | Admitting: Family

## 2023-02-25 DIAGNOSIS — I25118 Atherosclerotic heart disease of native coronary artery with other forms of angina pectoris: Secondary | ICD-10-CM

## 2023-02-25 NOTE — Telephone Encounter (Signed)
Rx request sent to pharmacy.  

## 2023-03-20 ENCOUNTER — Encounter (HOSPITAL_BASED_OUTPATIENT_CLINIC_OR_DEPARTMENT_OTHER): Payer: Self-pay

## 2023-03-21 LAB — LIPID PANEL
Chol/HDL Ratio: 2.1 ratio (ref 0.0–5.0)
Cholesterol, Total: 77 mg/dL — ABNORMAL LOW (ref 100–199)
HDL: 36 mg/dL — ABNORMAL LOW (ref >39)
LDL Chol Calc (NIH): 19 mg/dL (ref 0–99)
Triglycerides: 120 mg/dL (ref 0–149)
VLDL Cholesterol Cal: 22 mg/dL (ref 5–40)

## 2023-03-23 ENCOUNTER — Telehealth (HOSPITAL_BASED_OUTPATIENT_CLINIC_OR_DEPARTMENT_OTHER): Payer: Self-pay

## 2023-03-23 DIAGNOSIS — I25118 Atherosclerotic heart disease of native coronary artery with other forms of angina pectoris: Secondary | ICD-10-CM

## 2023-03-23 MED ORDER — ATORVASTATIN CALCIUM 40 MG PO TABS: 80.0000 mg | ORAL_TABLET | Freq: Every day | ORAL | 3 refills | Status: DC

## 2023-03-23 NOTE — Telephone Encounter (Addendum)
Results called to patient who verbalizes understanding! Rx to pharm!    ----- Message from Alver Sorrow, NP sent at 03/23/2023 10:37 AM EDT ----- Cholesterol panel much improved! LDL of 19 which is at goal of <55. May reduce Atorvastatin to 40mg  daily and continue Repatha.

## 2023-04-13 ENCOUNTER — Encounter (HOSPITAL_BASED_OUTPATIENT_CLINIC_OR_DEPARTMENT_OTHER): Payer: Self-pay

## 2023-04-13 MED ORDER — OZEMPIC (2 MG/DOSE) 8 MG/3ML ~~LOC~~ SOPN: 2.0000 mg | PEN_INJECTOR | SUBCUTANEOUS | 1 refills | Status: DC

## 2023-05-25 ENCOUNTER — Ambulatory Visit (HOSPITAL_BASED_OUTPATIENT_CLINIC_OR_DEPARTMENT_OTHER): Payer: BC Managed Care – PPO | Admitting: Cardiology

## 2023-05-25 ENCOUNTER — Encounter (HOSPITAL_BASED_OUTPATIENT_CLINIC_OR_DEPARTMENT_OTHER): Payer: Self-pay | Admitting: Cardiology

## 2023-05-25 VITALS — BP 113/77 | HR 76 | Ht 75.0 in | Wt 263.4 lb

## 2023-05-25 DIAGNOSIS — E782 Mixed hyperlipidemia: Secondary | ICD-10-CM | POA: Diagnosis not present

## 2023-05-25 DIAGNOSIS — I10 Essential (primary) hypertension: Secondary | ICD-10-CM

## 2023-05-25 DIAGNOSIS — I2510 Atherosclerotic heart disease of native coronary artery without angina pectoris: Secondary | ICD-10-CM | POA: Diagnosis not present

## 2023-05-25 DIAGNOSIS — Z955 Presence of coronary angioplasty implant and graft: Secondary | ICD-10-CM

## 2023-05-25 DIAGNOSIS — E669 Obesity, unspecified: Secondary | ICD-10-CM | POA: Diagnosis not present

## 2023-05-25 DIAGNOSIS — Z8249 Family history of ischemic heart disease and other diseases of the circulatory system: Secondary | ICD-10-CM

## 2023-05-25 DIAGNOSIS — E119 Type 2 diabetes mellitus without complications: Secondary | ICD-10-CM

## 2023-05-25 NOTE — Progress Notes (Signed)
Cardiology Office Note:  .    Date:  05/25/2023  ID:  Loel Ro, DOB 1971/01/02, MRN [id number] PCP: Judge Stall, MD  [facility] Providers Cardiologist:  Jodelle Red, MD     History of Present Illness: .    Randall Mullen is a 52 y.o. 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   CV history: NSTEMI, considered for CABG vs stents, recommended for stents and received 06/2019. Sister and dad had fatty liver. After shared decision making we changed to aspirin alone starting after 06/29/20. We changed to metoprolol succinate 25 mg daily for ease of administration.   At his visit 02/2023, his weight was down to 264 lbs in the office (lost 44 lbs from peak weight 308). He was on 1 mg Ozempic with plans to increase to 2 mg in a few weeks. He felt his sudden lightheadedness upon standing up quickly was a side effect of Ozempic. He denied lightheadedness during his weight lifting or cardio exercises. Blood pressures were controlled at home and in the office. Discussed stopping metoprolol but decided to continue. His LDL was 19 as of 03/20/2023 and it was recommended to reduce atorvastatin to 40 mg; continued Repatha.  Today, he is feeling frustrated as he seems to have stopped losing weight. He has been fluctuating between 258-262 lbs on the 2 mg dose of Ozempic. Has continued with the same diet, smaller meals during the day with some snacks. Usually has a protein shake for lunch. 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 Otherwise he has been feeling great on the Ozempic.   He denies any palpitations, chest pain, shortness of breath, peripheral edema, lightheadedness, headaches, syncope, orthopnea, or PND.  ROS:  Please see the history of present illness. ROS otherwise negative except as noted.   Studies Reviewed: Marland Kitchen         Physical Exam:     VS:  BP 113/77 (BP Location: Left Arm, Patient Position: Sitting, Cuff Size: Large)   Pulse 76   Ht 6\' 3"  (1.905 m)   Wt 263 lb 6.4 oz (119.5 kg)   SpO2 99%   BMI 32.92 kg/m    Wt Readings from Last 3 Encounters:  05/25/23 263 lb 6.4 oz (119.5 kg)  02/16/23 264 lb (119.7 kg)  11/24/22 287 lb (130.2 kg)    GEN: Well nourished, well developed in no acute distress HEENT: Normal, moist mucous membranes NECK: No JVD CARDIAC: regular rhythm, normal S1 and S2, no rubs or gallops. No murmur. VASCULAR: Radial and DP pulses 2+ bilaterally. No carotid bruits RESPIRATORY:  Clear to auscultation without rales, wheezing or rhonchi  ABDOMEN: Soft, non-tender, non-distended MUSCULOSKELETAL:  Ambulates independently SKIN: Warm and dry, no edema NEUROLOGIC:  Alert and oriented x 3. No focal neuro deficits noted. PSYCHIATRIC:  Normal affect   ASSESSMENT AND PLAN: .    3V CAD s/p PCI 06/30/2019. -was on DAPT for one year, now on aspirin 81 mg daily since 2021 -continue atorvastatin 80 mg daily, repatha. Last LDL 19 -counseled on diet, exercise. Doing excellent with this. -discussed PDE5 inhibitors and risk with SL NG. Ok to use PDEi but need to alert any EMS/ER to use if he has chest pain to alert them to life threatening interaction with nitrate. -normal EF -no angina, will taper off metoprolol, counseled on symptoms to watch for   Mixed  hyperlipidemia -normal lp(a) -continue atorvastatin 40 mg daily, PCSK9i. Last LDL 19, TG 120   Obesity Type II diabetes, prior A1c 6.5 -BMI down to 33 -doing excellent with semaglutide, peak weight 308 lbs, current 263 lbs -he has levelled off on the 2 mg dose but is doing well. After shared decision making, will continue on the 2 mg dose for now and readdress decreasing dose/taper at future visits.   Hypertension: continue valsartan, may need to decrease in the future based on BP   Additional CV risk: Family history: father with 6 caths, multiple  family members with MI in late 40s/50s   Cardiac risk counseling and prevention recommendations: -recommend heart healthy/Mediterranean diet, with whole grains, fruits, vegetable, fish, lean meats, nuts, and olive oil. Limit salt. -recommend moderate walking, 3-5 times/week for 30-50 minutes each session. Aim for at least 150 minutes.week. Goal should be pace of 3 miles/hours, or walking 1.5 miles in 30 minutes -recommend avoidance of tobacco products. Avoid excess alcohol.  Dispo: Follow-up in 6 months, or sooner as needed.  I,Mathew Stumpf,acting as a Neurosurgeon for [employer], MD.,have documented all relevant documentation on the behalf of Jodelle Red, MD,as directed by  Jodelle Red, MD while in the presence of Jodelle Red, MD.  I, Jodelle Red, MD, have reviewed all documentation for this visit. The documentation on 05/25/23 for the exam, diagnosis, procedures, and orders are all accurate and complete.   Signed, Carlena Bjornstad

## 2023-05-25 NOTE — Patient Instructions (Signed)
Medication Instructions:  We will taper off the metoprolol. Cut the pill in half (so 12.5 mg), and take 1/2 pill daily for two weeks. Then decrease to 1/2 pill EVERY OTHER day for two weeks, then stop.  Follow-Up: At [facility], you and your health needs are our priority.  As part of our continuing mission to provide you with exceptional heart care, we have created designated Provider Care Teams.  These Care Teams include your primary Cardiologist (physician) and Advanced Practice Providers (APPs -  Physician Assistants and Nurse Practitioners) who all work together to provide you with the care you need, when you need it.  We recommend signing up for the patient portal called "MyChart".  Sign up information is provided on this After Visit Summary.  MyChart is used to connect with patients for Virtual Visits (Telemedicine).  Patients are able to view lab/test results, encounter notes, upcoming appointments, etc.  Non-urgent messages can be sent to your provider as well.   To learn more about what you can do with MyChart, go to [email or web address].    Your next appointment:   6 months with Dr. Cristal Deer

## 2023-06-06 ENCOUNTER — Other Ambulatory Visit (HOSPITAL_BASED_OUTPATIENT_CLINIC_OR_DEPARTMENT_OTHER): Payer: Self-pay | Admitting: Cardiology

## 2023-08-22 ENCOUNTER — Encounter (HOSPITAL_BASED_OUTPATIENT_CLINIC_OR_DEPARTMENT_OTHER): Payer: Self-pay

## 2023-08-22 ENCOUNTER — Other Ambulatory Visit (HOSPITAL_BASED_OUTPATIENT_CLINIC_OR_DEPARTMENT_OTHER): Payer: Self-pay | Admitting: Cardiology

## 2023-08-24 NOTE — Telephone Encounter (Signed)
Repatha refill. Please advise. Thank you!

## 2023-08-25 ENCOUNTER — Encounter (HOSPITAL_BASED_OUTPATIENT_CLINIC_OR_DEPARTMENT_OTHER): Payer: Self-pay | Admitting: Pharmacist Clinician (PhC)/ Clinical Pharmacy Specialist

## 2023-08-26 ENCOUNTER — Telehealth: Payer: Self-pay | Admitting: Pharmacy Technician

## 2023-08-26 ENCOUNTER — Other Ambulatory Visit (HOSPITAL_COMMUNITY): Payer: Self-pay

## 2023-08-26 NOTE — Telephone Encounter (Signed)
Pharmacy Patient Advocate Encounter   Received notification from Patient Advice Request messages that prior authorization for reptha is required/requested.   Insurance verification completed.   The patient is insured through [facility] ADVANTAGE/RX ADVANCE .   Per test claim: Refill too soon. PA is not needed at this time. Medication was filled 08/24/23. Next eligible fill date is 10/26/23.  PA expires 10/24/23

## 2023-09-15 ENCOUNTER — Encounter (HOSPITAL_BASED_OUTPATIENT_CLINIC_OR_DEPARTMENT_OTHER): Payer: Self-pay

## 2023-09-15 ENCOUNTER — Other Ambulatory Visit (HOSPITAL_BASED_OUTPATIENT_CLINIC_OR_DEPARTMENT_OTHER): Payer: Self-pay

## 2023-09-15 MED ORDER — ATORVASTATIN CALCIUM 40 MG PO TABS: 40.0000 mg | ORAL_TABLET | Freq: Every day | ORAL | 3 refills | Status: AC

## 2023-11-15 ENCOUNTER — Encounter (HOSPITAL_BASED_OUTPATIENT_CLINIC_OR_DEPARTMENT_OTHER): Payer: Self-pay

## 2023-11-16 NOTE — Telephone Encounter (Signed)
 Rx team, please assist to see if new insurance require prior auth

## 2023-11-17 ENCOUNTER — Other Ambulatory Visit (HOSPITAL_COMMUNITY): Payer: Self-pay

## 2023-11-17 ENCOUNTER — Telehealth: Payer: Self-pay | Admitting: Pharmacy Technician

## 2023-11-17 NOTE — Telephone Encounter (Signed)
 Pharmacy Patient Advocate Encounter  Received notification from [facility] that Prior Authorization for Repatha has been APPROVED from 11/17/23 to 11/16/24. Unable to obtain price due to refill too soon rejection, last fill date 11/17/23 next available fill date05/06/25   PA #/Case ID/Reference #: [id number]

## 2023-11-17 NOTE — Telephone Encounter (Addendum)
 Pharmacy Patient Advocate Encounter   Received notification from Patient Advice Request messages that prior authorization for Repatha is required/requested.   Insurance verification completed.   The patient is insured through [employer] .   Per test claim: PA required; PA submitted to above mentioned insurance via CoverMyMeds Key/confirmation #/EOC [id number] Status is pending

## 2023-11-24 ENCOUNTER — Ambulatory Visit (HOSPITAL_BASED_OUTPATIENT_CLINIC_OR_DEPARTMENT_OTHER): Payer: BC Managed Care – PPO | Admitting: Cardiology

## 2024-01-08 ENCOUNTER — Ambulatory Visit (HOSPITAL_BASED_OUTPATIENT_CLINIC_OR_DEPARTMENT_OTHER): Admitting: Family

## 2024-01-08 ENCOUNTER — Encounter (HOSPITAL_BASED_OUTPATIENT_CLINIC_OR_DEPARTMENT_OTHER): Payer: Self-pay | Admitting: Family

## 2024-01-08 VITALS — BP 106/60 | HR 76 | Ht 75.0 in | Wt 256.0 lb

## 2024-01-08 DIAGNOSIS — I10 Essential (primary) hypertension: Secondary | ICD-10-CM | POA: Diagnosis not present

## 2024-01-08 DIAGNOSIS — I2510 Atherosclerotic heart disease of native coronary artery without angina pectoris: Secondary | ICD-10-CM

## 2024-01-08 DIAGNOSIS — E119 Type 2 diabetes mellitus without complications: Secondary | ICD-10-CM | POA: Diagnosis not present

## 2024-01-08 DIAGNOSIS — I25118 Atherosclerotic heart disease of native coronary artery with other forms of angina pectoris: Secondary | ICD-10-CM | POA: Diagnosis not present

## 2024-01-08 MED ORDER — NITROGLYCERIN 0.4 MG SL SUBL: 0.4000 mg | SUBLINGUAL_TABLET | SUBLINGUAL | 3 refills | Status: AC | PRN

## 2024-01-08 MED ORDER — VALSARTAN 80 MG PO TABS
80.0000 mg | ORAL_TABLET | Freq: Every day | ORAL | 3 refills | Status: DC
Start: 2024-01-08 — End: 2024-05-30

## 2024-01-08 NOTE — Patient Instructions (Addendum)
 Medication Instructions:   Continue your current medications.  *If you need a refill on your cardiac medications before your next appointment, please call your pharmacy*  Lab Work: Your physician recommends that you return for lab work today: A1c, CMET, lipid panel  If you have labs (blood work) drawn today and your tests are completely normal, you will receive your results only by: MyChart Message (if you have MyChart) OR A paper copy in the mail If you have any lab test that is abnormal or we need to change your treatment, we will call you to review the results.  Follow-Up: At [facility], you and your health needs are our priority.  As part of our continuing mission to provide you with exceptional heart care, our providers are all part of one team.  This team includes your primary Cardiologist (physician) and Advanced Practice Providers or APPs (Physician Assistants and Nurse Practitioners) who all work together to provide you with the care you need, when you need it.  Your next appointment:   6 month(s)  Sheryle Donning, MD, Slater Duncan, NP, or Neomi Banks, NP    We recommend signing up for the patient portal called "MyChart".  Sign up information is provided on this After Visit Summary.  MyChart is used to connect with patients for Virtual Visits (Telemedicine).  Patients are able to view lab/test results, encounter notes, upcoming appointments, etc.  Non-urgent messages can be sent to your provider as well.   To learn more about what you can do with MyChart, go to [email or web address].   Other Instructions  Let us  know if your blood pressure is routinely <110/60 or more than >130/80.

## 2024-01-08 NOTE — Progress Notes (Signed)
 Cardiology Office Note:  .   Date:  01/08/2024  ID:  Randall Mullen, DOB Oct 04, 1970, MRN [id number] PCP: Magdaleno Schooling, MD  [facility] Providers Cardiologist:  Sheryle Donning, MD    History of Present Illness: .   Randall Mullen is a 53 y.o. male  with a hx of HLD, CAD with NSTEMI and multivessel stent.  Prior NSTEMI and PCI 06/2019 with PTCA/DES to LAD, , posterolateral artery, prox RCA. Echo at the time with LVEF 55-60%, mild lVH, early diastolic dysfunction, no significant valvular abnormalities.    When seen 05/2020 Plavix was discontinued and Metoprolol  Tartrate consolidated to Metoprolol  Succinate.   At visit 05/25/2023 atorvastatin  reduced from 80 mg to 40 mg.  He was tapered off metoprolol .  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 March 1 started modified carnivore diet and endorses feeling better than he has in year. He and his wife are focusing on lean proteins (chicken, fish, low fat ground beef) and vegetables. They have cut out breads, sweets, sodas. Since last seen diagnosed with avascular necrosis of bilateral hips and anticipating requiring hip surgery.  Continues to exercise regularly makes of cardiac and weight training but has transition to stationary bike from treadmill as it is less bothersome to his hips.  BP at home ~120/80. Reports no shortness of breath nor dyspnea on exertion. Reports no chest pain, pressure, or tightness. No edema, orthopnea, PND. Reports no palpitations.  Notes lightheadedness with quick position changes.   ROS: Please see the history of present illness.    All other systems reviewed and are negative.   Studies Reviewed: .        Cardiac Studies & Procedures   ______________________________________________________________________________________________ CARDIAC CATHETERIZATION  CARDIAC CATHETERIZATION 06/30/2019  Conclusion  Prox LAD lesion is 99% stenosed.  Ost Cx to Prox Cx lesion is 50% stenosed.  Mid Cx to  Dist Cx lesion is 100% stenosed.  Prox RCA lesion is 80% stenosed.  RPAV lesion is 99% stenosed.  A drug-eluting stent was successfully placed using a STENT RESOLUTE ONYX 3.5X26.  Post intervention, there is a 0% residual stenosis.  A drug-eluting stent was successfully placed using a STENT RESOLUTE ONYX 2.25X30.  Post intervention, there is a 0% residual stenosis.  A drug-eluting stent was successfully placed using a STENT RESOLUTE ONYX 4.0X18.  Post intervention, there is a 0% residual stenosis.  1. Successful PTCA/DES x 1 proximal LAD 2. Successful PTCA/DES x 1 posterolateral artery 3. Successful PTCA/DES x 1 proximal RCA  Will continue DAPT with ASA and Brilinta  for one year. Continue statin and beta blocker. Likely d/c home tomorrow after hydration post dye load. BMET in am.  Findings Coronary Findings Diagnostic  Dominance: Right  Left Anterior Descending Prox LAD lesion is 99% stenosed.  Left Circumflex Ost Cx to Prox Cx lesion is 50% stenosed. Mid Cx to Dist Cx lesion is 100% stenosed. The lesion is chronically occluded.  Third Obtuse Marginal Branch Collaterals 3rd Mrg filled by collaterals from 2nd Mrg.  Right Coronary Artery Vessel is large. Prox RCA lesion is 80% stenosed.  Right Posterior Atrioventricular Artery RPAV lesion is 99% stenosed.  Intervention  Prox LAD lesion Stent CATH VISTA GUIDE 6FR XBLAD3.5 guide catheter was inserted. Lesion crossed with guidewire using a WIRE COUGAR XT STRL 190CM. Pre-stent angioplasty was performed using a BALLOON SAPPHIRE 2.0X15. A drug-eluting stent was successfully placed using a STENT RESOLUTE ONYX 3.5X26. Post-stent angioplasty was performed using a BALLOON SAPPHIRE Perdido [id number]. Post-Intervention Lesion Assessment The intervention  was successful. Pre-interventional TIMI flow is 3. Post-intervention TIMI flow is 3. No complications occurred at this lesion. There is a 0% residual stenosis post  intervention.  Prox RCA lesion Stent CATH VISTA GUIDE 6FR JR4 guide catheter was inserted. Pre-stent angioplasty was performed using a BALLOON SAPPHIRE 2.0X15. A drug-eluting stent was successfully placed using a STENT RESOLUTE ONYX 4.0X18. Stent strut is well apposed. Post-stent angioplasty was performed using a BALLOON SAPPHIRE Taylor 4.0X15. Post-Intervention Lesion Assessment The intervention was successful. Pre-interventional TIMI flow is 3. Post-intervention TIMI flow is 3. No complications occurred at this lesion. There is a 0% residual stenosis post intervention.  RPAV lesion Stent CATH VISTA GUIDE 6FR JR4 guide catheter was inserted. Lesion crossed with guidewire using a WIRE COUGAR XT STRL 190CM. Pre-stent angioplasty was performed using a BALLOON SAPPHIRE 2.0X15. A drug-eluting stent was successfully placed using a STENT RESOLUTE ONYX 2.25X30. Post-stent angioplasty was performed using a BALLOON SAPPHIRE [place] 2.5X15. Post-Intervention Lesion Assessment The intervention was successful. Pre-interventional TIMI flow is 3. Post-intervention TIMI flow is 3. No complications occurred at this lesion. There is a 0% residual stenosis post intervention.   CARDIAC CATHETERIZATION 06/29/2019  Conclusion  Prox RCA lesion is 80% stenosed.  RPAV lesion is 99% stenosed.  Ost Cx to Prox Cx lesion is 50% stenosed.  Prox LAD lesion is 99% stenosed.  Mid Cx to Dist Cx lesion is 100% stenosed.  1. Severe three vessel CAD 2. Severe stenosis proximal LAD. The mid and distal LAD fills briskly from antegrade flow but also seen to fill from faint right to left collaterals. 3. Moderate disease in the proximal Circumflex artery with chronic occlusion of the mid Circumflex just after the takeoff of the large obtuse marginal branch 4. Severe focal stenosis in the proximal segment of the large dominant RCA. Severe stenosis in the the proximal segment of the moderate caliber posterolateral artery 5. Normal  filling pressures.  Recommendations: Options for treatment of multi-vessel CAD include CABG vs multi-vessel stenting. Given his young age and focal nature of the disease in his RCA and LAD, I feel that stenting is the best long term option for treatment.  I have reviewed with my interventional colleagues this am and they agree. I have discussed this with the patient and his wife and they agree. Will load with Brilinta  180 mg po x 1 today. Plan PCI of the RCA and LAD tomorrow.  Findings Coronary Findings Diagnostic  Dominance: Right  Left Anterior Descending Prox LAD lesion is 99% stenosed.  Left Circumflex Ost Cx to Prox Cx lesion is 50% stenosed. Mid Cx to Dist Cx lesion is 100% stenosed. The lesion is chronically occluded.  Third Obtuse Marginal Branch Collaterals 3rd Mrg filled by collaterals from 2nd Mrg.  Right Coronary Artery Vessel is large. Prox RCA lesion is 80% stenosed.  Right Posterior Atrioventricular Artery RPAV lesion is 99% stenosed.  Intervention  No interventions have been documented.     ECHOCARDIOGRAM  ECHOCARDIOGRAM LIMITED 08/17/2019  Narrative ECHOCARDIOGRAM LIMITED REPORT    Patient Name:   ORLEY LAWRY Date of Exam: 08/17/2019 Medical Rec #:  [id number]          Height:       75.0 in Accession #:    [id number]         Weight:       270.0 lb Date of Birth:  1971-04-14         BSA:          2.49 m  Patient Age:    48 years           BP:           114/73 mmHg Patient Gender: M                  HR:           101 bpm. Exam Location:  Inpatient   Procedure: Limited Echo, Limited Color Doppler and Color Doppler  Indications:    Abnormal ECG R94.31  History:        Patient has prior history of Echocardiogram examinations, most recent 06/30/2019.  Sonographer:    Joleen Navy RDCS (AE) Referring Phys: [id number] [facility]  IMPRESSIONS   1. Left ventricular ejection fraction, by visual estimation, is 60 to 65%. The left ventricle  has normal function. There is mildly increased left ventricular hypertrophy. 2. Global right ventricle has normal systolic function.The right ventricular size is mildly enlarged. 3. Left atrial size was normal. 4. Right atrial size was normal. 5. The mitral valve is normal in structure. No evidence of mitral valve regurgitation. 6. The tricuspid valve is normal in structure. Tricuspid valve regurgitation is not demonstrated. 7. The aortic valve is tricuspid. Aortic valve regurgitation is not visualized. 8. The pulmonic valve was not well visualized. Pulmonic valve regurgitation not assessed. 9. TR signal is inadequate for assessing pulmonary artery systolic pressure.  FINDINGS Left Ventricle: Left ventricular ejection fraction, by visual estimation, is 60 to 65%. The left ventricle has normal function. The left ventricular internal cavity size was the left ventricle is normal in size. There is mildly increased left ventricular hypertrophy. Left ventricular diastolic parameters were normal.  Right Ventricle: The right ventricular size is mildly enlarged. No increase in right ventricular wall thickness. Global RV systolic function is has normal systolic function.  Left Atrium: Left atrial size was normal in size.  Right Atrium: Right atrial size was normal in size  Pericardium: There is no evidence of pericardial effusion.  Mitral Valve: The mitral valve is normal in structure. MV Area by PHT, 2.13 cm. MV PHT, 103.24 msec. No evidence of mitral valve regurgitation.  Tricuspid Valve: The tricuspid valve is normal in structure. Tricuspid valve regurgitation is not demonstrated.  Aortic Valve: The aortic valve is tricuspid. Aortic valve regurgitation is not visualized.  Pulmonic Valve: The pulmonic valve was not well visualized. Pulmonic valve regurgitation not assessed.  Aorta: The aortic root is normal in size and structure.  Shunts: The interatrial septum was not well  visualized.   LEFT VENTRICLE         Normals PLAX 2D LVIDd:         5.80 cm 3.6 cm   Diastology                  Normals LVIDs:         3.80 cm 1.7 cm   LV e' lateral:   13.60 cm/s 6.42 cm/s LV PW:         1.00 cm 1.4 cm   LV E/e' lateral: 4.7        15.4 LV IVS:        0.90 cm 1.3 cm   LV e' medial:    10.90 cm/s 6.96 cm/s LV SV:         105 ml  79 ml    LV E/e' medial:  5.8        6.96 LV SV Index:   40.54   45  ml/m2   LEFT ATRIUM             Index       RIGHT ATRIUM           Index LA diam:        4.70 cm 1.89 cm/m  RA Area:     21.30 cm LA Vol (A2C):   75.8 ml 30.41 ml/m RA Volume:   71.70 ml  28.76 ml/m LA Vol (A4C):   48.1 ml 19.29 ml/m LA Biplane Vol: 67.1 ml 26.92 ml/m  AORTA                 Normals Ao Root diam: 3.70 cm 31 mm  MITRAL VALVE               Normals MV Area (PHT): 2.13 cm MV PHT:        103.24 msec 55 ms MV Decel Time: 356 msec    187 ms MV E velocity: 63.40 cm/s 103 cm/s MV A velocity: 53.60 cm/s 70.3 cm/s MV E/A ratio:  1.18       1.5   Carson Clara MD Electronically signed by Carson Clara MD Signature Date/Time: 08/17/2019/2:04:53 PM    Final          ______________________________________________________________________________________________      Risk Assessment/Calculations:             Physical Exam:   VS:  BP 106/60   Pulse 76   Ht 6\' 3"  (1.905 m)   Wt 256 lb (116.1 kg)   SpO2 100%   BMI 32.00 kg/m    Wt Readings from Last 3 Encounters:  01/08/24 256 lb (116.1 kg)  05/25/23 263 lb 6.4 oz (119.5 kg)  02/16/23 264 lb (119.7 kg)    Vitals:   01/08/24 1109 01/08/24 1134  BP: (!) 86/70 106/60  Pulse: 76   Height: 6\' 3"  (1.905 m)   Weight: 256 lb (116.1 kg)   SpO2: 100%   BMI (Calculated): 32     GEN: Well nourished, well developed in no acute distress NECK: No JVD; No carotid bruits CARDIAC: RRR, no murmurs, rubs, gallops RESPIRATORY:  Clear to auscultation without rales, wheezing or rhonchi   ABDOMEN: Soft, non-tender, non-distended EXTREMITIES:  No edema; No deformity   ASSESSMENT AND PLAN: .    CAD/HLD, LDL goal 70- Stable with no anginal symptoms. No indication for ischemic evaluation.  GDMT aspirin  81 mg daily, atorvastatin  40 mg daily, Repatha  140 mg daily, as needed nitroglycerin .  Refills provided.  Update CMET, lipid panel today.  If LDL remains below goal of 70, consider further reduction in dose of atorvastatin .  HTN- Initial BP in clinic 86/70 with repeat 106/60 without intervention. Had 2 cups of coffee this morning but no breakfast. Home BP ~120/80. Rare lightheadedness with quick position changes which is overall not bothersome. Continue Valsartan  80mg  daily. If persistent hypotension at home or progression in lightheadedness, plan to reduce dose.   DM2 / Obesity - Congratulated on weight loss through lifestyle changes and Ozempic . Continue Ozempic  2mg  daily. Update A1c.        Dispo: fu in 6 months   Signed, Clearnce Curia, NP

## 2024-01-09 LAB — COMPREHENSIVE METABOLIC PANEL WITH GFR
ALT: 19 IU/L (ref 0–44)
AST: 20 IU/L (ref 0–40)
Albumin: 4.8 g/dL (ref 4.1–5.1)
Alkaline Phosphatase: 57 IU/L (ref 44–121)
BUN/Creatinine Ratio: 14 (ref 9–20)
BUN: 17 mg/dL (ref 6–24)
Bilirubin Total: 0.4 mg/dL (ref 0.0–1.2)
CO2: 23 mmol/L (ref 20–29)
Calcium: 9.7 mg/dL (ref 8.7–10.2)
Chloride: 102 mmol/L (ref 96–106)
Creatinine, Ser: 1.22 mg/dL (ref 0.76–1.27)
Globulin, Total: 2.2 g/dL (ref 1.5–4.5)
Glucose: 90 mg/dL (ref 70–99)
Potassium: 4.4 mmol/L (ref 3.5–5.2)
Sodium: 142 mmol/L (ref 134–144)
Total Protein: 7 g/dL (ref 6.0–8.5)
eGFR: 72 mL/min/{1.73_m2} (ref >59)

## 2024-01-09 LAB — LIPID PANEL
Chol/HDL Ratio: 2.5 ratio (ref 0.0–5.0)
Cholesterol, Total: 103 mg/dL (ref 100–199)
HDL: 41 mg/dL (ref >39)
LDL Chol Calc (NIH): 39 mg/dL (ref 0–99)
Triglycerides: 131 mg/dL (ref 0–149)
VLDL Cholesterol Cal: 23 mg/dL (ref 5–40)

## 2024-01-09 LAB — HEMOGLOBIN A1C
Est. average glucose Bld gHb Est-mCnc: 114 mg/dL
Hgb A1c MFr Bld: 5.6 % (ref 4.8–5.6)

## 2024-01-11 ENCOUNTER — Encounter (HOSPITAL_BASED_OUTPATIENT_CLINIC_OR_DEPARTMENT_OTHER): Payer: Self-pay

## 2024-02-15 ENCOUNTER — Ambulatory Visit (HOSPITAL_BASED_OUTPATIENT_CLINIC_OR_DEPARTMENT_OTHER): Payer: BC Managed Care – PPO | Admitting: Family

## 2024-04-27 ENCOUNTER — Encounter (HOSPITAL_BASED_OUTPATIENT_CLINIC_OR_DEPARTMENT_OTHER): Payer: Self-pay

## 2024-04-28 NOTE — Telephone Encounter (Signed)
 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   Offered Monday 8/25 at either 0825 or 1005 with Reche Finder NP.   Will await for the pt to respond back.

## 2024-04-28 NOTE — Telephone Encounter (Signed)
 Will need OV with MD or APP for  his 6 month follow up in order to schedule stress test as will need informed consent for stress test.  Reche GORMAN Finder, NP

## 2024-05-09 ENCOUNTER — Ambulatory Visit (HOSPITAL_BASED_OUTPATIENT_CLINIC_OR_DEPARTMENT_OTHER): Admitting: Family

## 2024-05-18 ENCOUNTER — Other Ambulatory Visit (HOSPITAL_BASED_OUTPATIENT_CLINIC_OR_DEPARTMENT_OTHER): Payer: Self-pay | Admitting: Cardiology

## 2024-05-18 DIAGNOSIS — E119 Type 2 diabetes mellitus without complications: Secondary | ICD-10-CM

## 2024-05-30 ENCOUNTER — Ambulatory Visit (HOSPITAL_BASED_OUTPATIENT_CLINIC_OR_DEPARTMENT_OTHER): Admitting: Family

## 2024-05-30 ENCOUNTER — Encounter (HOSPITAL_BASED_OUTPATIENT_CLINIC_OR_DEPARTMENT_OTHER): Payer: Self-pay | Admitting: Family

## 2024-05-30 VITALS — BP 114/60 | HR 67 | Resp 16 | Ht 75.0 in | Wt 259.0 lb

## 2024-05-30 DIAGNOSIS — I10 Essential (primary) hypertension: Secondary | ICD-10-CM | POA: Diagnosis not present

## 2024-05-30 DIAGNOSIS — E119 Type 2 diabetes mellitus without complications: Secondary | ICD-10-CM

## 2024-05-30 DIAGNOSIS — E785 Hyperlipidemia, unspecified: Secondary | ICD-10-CM

## 2024-05-30 DIAGNOSIS — I2510 Atherosclerotic heart disease of native coronary artery without angina pectoris: Secondary | ICD-10-CM

## 2024-05-30 MED ORDER — VALSARTAN 80 MG PO TABS: 40.0000 mg | ORAL_TABLET | Freq: Every day | ORAL | Status: DC

## 2024-05-30 NOTE — Patient Instructions (Addendum)
 Medication Instructions:  REDUCE Valsartan  to 40mg  (half tablet) daily *If you need a refill on your cardiac medications before your next appointment, please call your pharmacy*  Testing/Procedures: Your physician has requested that you have an exercise tolerance test. Please also follow instruction sheet, as given.   Follow-Up: At [facility], you and your health needs are our priority.  As part of our continuing mission to provide you with exceptional heart care, our providers are all part of one team.  This team includes your primary Cardiologist (physician) and Advanced Practice Providers or APPs (Physician Assistants and Nurse Practitioners) who all work together to provide you with the care you need, when you need it.  Your next appointment:   6 month(s)  Provider:   Shelda Bruckner, MD, Rosaline Bane, NP, or Reche Finder, NP    We recommend signing up for the patient portal called MyChart.  Sign up information is provided on this After Visit Summary.  MyChart is used to connect with patients for Virtual Visits (Telemedicine).  Patients are able to view lab/test results, encounter notes, upcoming appointments, etc.  Non-urgent messages can be sent to your provider as well.   To learn more about what you can do with MyChart, go to [email or web address].   Other Instructions  We will send your stress test results to [facility] via fax at [phone]   Exercise Stress Test   The test will take approximately 45 minutes to complete.  How to prepare for your Exercise Stress Test: Do bring a list of your current medications with you.  If not listed below, you may take your medications as normal. Do wear comfortable clothes (no dresses or overalls) and walking shoes, tennis shoes preferred (no heels or open toed shoes are allowed) Do Not wear cologne, perfume, aftershave or lotions (deodorant is allowed).  If these instructions are not followed,  your test will have to be rescheduled.  If you have questions or concerns about your appointment, you can call the Stress Lab at [phone].  If you cannot keep your appointment, please provide 24 hours notification to the Stress Lab, to avoid a possible $50 charge to your account

## 2024-05-30 NOTE — Progress Notes (Signed)
 Cardiology Office Note:  .   Date:  05/30/2024  ID:  Randall Mullen, DOB 02/13/1971, MRN [id number] PCP: Lennie Boom, MD  [facility] Providers Cardiologist:  Shelda Bruckner, MD    History of Present Illness: .   Randall Mullen is a 53 y.o. male  with a hx of HLD, CAD with NSTEMI and multivessel stent.  Prior NSTEMI and PCI 06/2019 with PTCA/DES to LAD, , posterolateral artery, prox RCA. Echo at the time with LVEF 55-60%, mild lVH, early diastolic dysfunction, no significant valvular abnormalities.    When seen 05/2020 Plavix was discontinued and Metoprolol  Tartrate consolidated to Metoprolol  Succinate.   At visit 05/25/2023 atorvastatin  reduced from 80 mg to 40 mg.  He was tapered off metoprolol . Seen 12/2023 doing well.   Presents today for follow up and DOT clearance. 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  Exercising regularly with combination of cardio and strength training. Reports no shortness of breath nor dyspnea on exertion. Reports no chest pain, pressure, or tightness. No edema, orthopnea, PND. Reports no palpitations.  He was diagnosed with sleep apnea many years ago but has not used CPAP in some time. Since significant weight loss, he no longer snores and wakes feeling well rested. He has had syncope only with lab draws consistent with vasovagal episode. No recent episodes.   ROS: Please see the history of present illness.    All other systems reviewed and are negative.   Studies Reviewed: .          Cardiac Studies & Procedures   ______________________________________________________________________________________________ CARDIAC CATHETERIZATION  CARDIAC CATHETERIZATION 06/30/2019  Conclusion  Prox LAD lesion is 99% stenosed.  Ost Cx to Prox Cx lesion is 50% stenosed.  Mid Cx to Dist Cx lesion is 100% stenosed.  Prox RCA lesion is 80% stenosed.  RPAV lesion is 99% stenosed.  A  drug-eluting stent was successfully placed using a STENT RESOLUTE ONYX 3.5X26.  Post intervention, there is a 0% residual stenosis.  A drug-eluting stent was successfully placed using a STENT RESOLUTE ONYX 2.25X30.  Post intervention, there is a 0% residual stenosis.  A drug-eluting stent was successfully placed using a STENT RESOLUTE ONYX 4.0X18.  Post intervention, there is a 0% residual stenosis.  1. Successful PTCA/DES x 1 proximal LAD 2. Successful PTCA/DES x 1 posterolateral artery 3. Successful PTCA/DES x 1 proximal RCA  Will continue DAPT with ASA and Brilinta  for one year. Continue statin and beta blocker. Likely d/c home tomorrow after hydration post dye load. BMET in am.  Findings Coronary Findings Diagnostic  Dominance: Right  Left Anterior Descending Prox LAD lesion is 99% stenosed.  Left Circumflex Ost Cx to Prox Cx lesion is 50% stenosed. Mid Cx to Dist Cx lesion is 100% stenosed. The lesion is chronically occluded.  Third Obtuse Marginal Branch Collaterals 3rd Mrg filled by collaterals from 2nd Mrg.  Right Coronary Artery Vessel is large. Prox RCA lesion is 80% stenosed.  Right Posterior Atrioventricular Artery RPAV lesion is 99% stenosed.  Intervention  Prox LAD lesion Stent CATH VISTA GUIDE 6FR XBLAD3.5 guide catheter was inserted. Lesion crossed with guidewire using a WIRE COUGAR XT STRL 190CM. Pre-stent angioplasty was performed using a BALLOON SAPPHIRE 2.0X15. A drug-eluting stent was successfully placed using a STENT RESOLUTE ONYX 3.5X26. Post-stent angioplasty was performed using a BALLOON SAPPHIRE [place] [id number]. Post-Intervention Lesion Assessment The intervention was successful. Pre-interventional TIMI flow is 3. Post-intervention TIMI flow is 3. No  complications occurred at this lesion. There is a 0% residual stenosis post intervention.  Prox RCA lesion Stent CATH VISTA GUIDE 6FR JR4 guide catheter was inserted. Pre-stent angioplasty was  performed using a BALLOON SAPPHIRE 2.0X15. A drug-eluting stent was successfully placed using a STENT RESOLUTE ONYX 4.0X18. Stent strut is well apposed. Post-stent angioplasty was performed using a BALLOON SAPPHIRE [place] 4.0X15. Post-Intervention Lesion Assessment The intervention was successful. Pre-interventional TIMI flow is 3. Post-intervention TIMI flow is 3. No complications occurred at this lesion. There is a 0% residual stenosis post intervention.  RPAV lesion Stent CATH VISTA GUIDE 6FR JR4 guide catheter was inserted. Lesion crossed with guidewire using a WIRE COUGAR XT STRL 190CM. Pre-stent angioplasty was performed using a BALLOON SAPPHIRE 2.0X15. A drug-eluting stent was successfully placed using a STENT RESOLUTE ONYX 2.25X30. Post-stent angioplasty was performed using a BALLOON SAPPHIRE [place] 2.5X15. Post-Intervention Lesion Assessment The intervention was successful. Pre-interventional TIMI flow is 3. Post-intervention TIMI flow is 3. No complications occurred at this lesion. There is a 0% residual stenosis post intervention.   CARDIAC CATHETERIZATION 06/29/2019  Conclusion  Prox RCA lesion is 80% stenosed.  RPAV lesion is 99% stenosed.  Ost Cx to Prox Cx lesion is 50% stenosed.  Prox LAD lesion is 99% stenosed.  Mid Cx to Dist Cx lesion is 100% stenosed.  1. Severe three vessel CAD 2. Severe stenosis proximal LAD. The mid and distal LAD fills briskly from antegrade flow but also seen to fill from faint right to left collaterals. 3. Moderate disease in the proximal Circumflex artery with chronic occlusion of the mid Circumflex just after the takeoff of the large obtuse marginal branch 4. Severe focal stenosis in the proximal segment of the large dominant RCA. Severe stenosis in the the proximal segment of the moderate caliber posterolateral artery 5. Normal filling pressures.  Recommendations: Options for treatment of multi-vessel CAD include CABG vs multi-vessel stenting.  Given his young age and focal nature of the disease in his RCA and LAD, I feel that stenting is the best long term option for treatment.  I have reviewed with my interventional colleagues this am and they agree. I have discussed this with the patient and his wife and they agree. Will load with Brilinta  180 mg po x 1 today. Plan PCI of the RCA and LAD tomorrow.  Findings Coronary Findings Diagnostic  Dominance: Right  Left Anterior Descending Prox LAD lesion is 99% stenosed.  Left Circumflex Ost Cx to Prox Cx lesion is 50% stenosed. Mid Cx to Dist Cx lesion is 100% stenosed. The lesion is chronically occluded.  Third Obtuse Marginal Branch Collaterals 3rd Mrg filled by collaterals from 2nd Mrg.  Right Coronary Artery Vessel is large. Prox RCA lesion is 80% stenosed.  Right Posterior Atrioventricular Artery RPAV lesion is 99% stenosed.  Intervention  No interventions have been documented.     ECHOCARDIOGRAM  ECHOCARDIOGRAM LIMITED 08/17/2019  Narrative ECHOCARDIOGRAM LIMITED REPORT    Patient Name:   ALEXA GOLEBIEWSKI Date of Exam: 08/17/2019 Medical Rec #:  [id number]          Height:       75.0 in Accession #:    [id number]         Weight:       270.0 lb Date of Birth:  1970-12-05         BSA:          2.49 m Patient Age:    48 years  BP:           114/73 mmHg Patient Gender: M                  HR:           101 bpm. Exam Location:  Inpatient   Procedure: Limited Echo, Limited Color Doppler and Color Doppler  Indications:    Abnormal ECG R94.31  History:        Patient has prior history of Echocardiogram examinations, most recent 06/30/2019.  Sonographer:    Augustin Seals RDCS (AE) Referring Phys: [id number] [facility]  IMPRESSIONS   1. Left ventricular ejection fraction, by visual estimation, is 60 to 65%. The left ventricle has normal function. There is mildly increased left ventricular hypertrophy. 2. Global right ventricle has normal  systolic function.The right ventricular size is mildly enlarged. 3. Left atrial size was normal. 4. Right atrial size was normal. 5. The mitral valve is normal in structure. No evidence of mitral valve regurgitation. 6. The tricuspid valve is normal in structure. Tricuspid valve regurgitation is not demonstrated. 7. The aortic valve is tricuspid. Aortic valve regurgitation is not visualized. 8. The pulmonic valve was not well visualized. Pulmonic valve regurgitation not assessed. 9. TR signal is inadequate for assessing pulmonary artery systolic pressure.  FINDINGS Left Ventricle: Left ventricular ejection fraction, by visual estimation, is 60 to 65%. The left ventricle has normal function. The left ventricular internal cavity size was the left ventricle is normal in size. There is mildly increased left ventricular hypertrophy. Left ventricular diastolic parameters were normal.  Right Ventricle: The right ventricular size is mildly enlarged. No increase in right ventricular wall thickness. Global RV systolic function is has normal systolic function.  Left Atrium: Left atrial size was normal in size.  Right Atrium: Right atrial size was normal in size  Pericardium: There is no evidence of pericardial effusion.  Mitral Valve: The mitral valve is normal in structure. MV Area by PHT, 2.13 cm. MV PHT, 103.24 msec. No evidence of mitral valve regurgitation.  Tricuspid Valve: The tricuspid valve is normal in structure. Tricuspid valve regurgitation is not demonstrated.  Aortic Valve: The aortic valve is tricuspid. Aortic valve regurgitation is not visualized.  Pulmonic Valve: The pulmonic valve was not well visualized. Pulmonic valve regurgitation not assessed.  Aorta: The aortic root is normal in size and structure.  Shunts: The interatrial septum was not well visualized.   LEFT VENTRICLE         Normals PLAX 2D LVIDd:         5.80 cm 3.6 cm   Diastology                   Normals LVIDs:         3.80 cm 1.7 cm   LV e' lateral:   13.60 cm/s 6.42 cm/s LV PW:         1.00 cm 1.4 cm   LV E/e' lateral: 4.7        15.4 LV IVS:        0.90 cm 1.3 cm   LV e' medial:    10.90 cm/s 6.96 cm/s LV SV:         105 ml  79 ml    LV E/e' medial:  5.8        6.96 LV SV Index:   40.54   45 ml/m2   LEFT ATRIUM  Index       RIGHT ATRIUM           Index LA diam:        4.70 cm 1.89 cm/m  RA Area:     21.30 cm LA Vol (A2C):   75.8 ml 30.41 ml/m RA Volume:   71.70 ml  28.76 ml/m LA Vol (A4C):   48.1 ml 19.29 ml/m LA Biplane Vol: 67.1 ml 26.92 ml/m  AORTA                 Normals Ao Root diam: 3.70 cm 31 mm  MITRAL VALVE               Normals MV Area (PHT): 2.13 cm MV PHT:        103.24 msec 55 ms MV Decel Time: 356 msec    187 ms MV E velocity: 63.40 cm/s 103 cm/s MV A velocity: 53.60 cm/s 70.3 cm/s MV E/A ratio:  1.18       1.5   Lonni Nanas MD Electronically signed by Lonni Nanas MD Signature Date/Time: 08/17/2019/2:04:53 PM    Final          ______________________________________________________________________________________________      Risk Assessment/Calculations:             Physical Exam:   VS:  BP 114/60 (BP Location: Left Arm, Patient Position: Sitting, Cuff Size: Large)   Pulse 67   Resp 16   Ht 6' 3 (1.905 m)   Wt 259 lb (117.5 kg)   SpO2 96%   BMI 32.37 kg/m    Wt Readings from Last 3 Encounters:  05/30/24 259 lb (117.5 kg)  01/08/24 256 lb (116.1 kg)  05/25/23 263 lb 6.4 oz (119.5 kg)    Vitals:   05/30/24 1500  BP: 114/60  Pulse: 67  Resp: 16  Height: 6' 3 (1.905 m)  Weight: 259 lb (117.5 kg)  SpO2: 96%  BMI (Calculated): 32.37    GEN: Well nourished, well developed in no acute distress NECK: No JVD; No carotid bruits CARDIAC: RRR, no murmurs, rubs, gallops RESPIRATORY:  Clear to auscultation without rales, wheezing or rhonchi  ABDOMEN: Soft, non-tender, non-distended EXTREMITIES:   No edema; No deformity   ASSESSMENT AND PLAN: .    CAD/HLD, LDL goal 70- Stable with no anginal symptoms. ETT for DOT clearance. GDMT aspirin  81 mg daily, atorvastatin  40 mg daily, Repatha  140 mg daily, as needed nitroglycerin .  Tolerating cardiac meds without issue. Recommend aiming for 150 minutes of moderate intensity activity per week and following a heart healthy diet.    HTN- BP relatively hypotensive, will reduce to Valsartan  40mg  daily. Discussed to monitor BP at home at least 2 hours after medications and sitting for 5-10 minutes.   DM2 / Obesity - Congratulated on weight loss through lifestyle changes and Ozempic . Continue Ozempic  2mg  daily.   Syncope - prior syncope with lab draws consistent with vasovagal. No indication for cardiac workup.   Sleep apnea- diagnosis many years ago. Has not worn CPAP in some time. Since weight loss: no snoring, no daytime somnolence. No indication for CPAP at this time.   Informed Consent   Shared Decision Making/Informed Consent The risks [chest pain, shortness of breath, cardiac arrhythmias, dizziness, blood pressure fluctuations, myocardial infarction, stroke/transient ischemic attack, and life-threatening complications (estimated to be 1 in 10,000)], benefits (risk stratification, diagnosing coronary artery disease, treatment guidance) and alternatives of an exercise tolerance test were discussed in detail with Mr. Radle and  he agrees to proceed.        Dispo: follow up  in 6 months   Signed, Reche GORMAN Finder, NP

## 2024-05-31 ENCOUNTER — Other Ambulatory Visit: Payer: Self-pay | Admitting: Medical Genetics

## 2024-06-14 ENCOUNTER — Encounter (HOSPITAL_COMMUNITY): Payer: Self-pay

## 2024-06-27 ENCOUNTER — Telehealth (HOSPITAL_COMMUNITY): Payer: Self-pay

## 2024-06-27 NOTE — Telephone Encounter (Signed)
Spoke with the patient, detailed instructions given. He stated that he would be here for his test. S.Dannell Gortney CCT

## 2024-06-28 ENCOUNTER — Ambulatory Visit (HOSPITAL_COMMUNITY)
Admission: RE | Admit: 2024-06-28 | Discharge: 2024-06-28 | Disposition: A | Discharge status: Home / Self Care | Source: Ambulatory Visit | Attending: Internal Medicine | Admitting: Internal Medicine

## 2024-06-28 DIAGNOSIS — I2510 Atherosclerotic heart disease of native coronary artery without angina pectoris: Secondary | ICD-10-CM | POA: Insufficient documentation

## 2024-06-28 LAB — EXERCISE TOLERANCE TEST
Angina Index: 0
Duke Treadmill Score: -3
Estimated workload: 13.7
Exercise duration (min): 12 min
Exercise duration (sec): 13 s
MPHR: 170 {beats}/min
Peak HR: 146 {beats}/min
Percent HR: 85 %
Rest HR: 82 {beats}/min
ST Depression (mm): 3 mm

## 2024-06-29 ENCOUNTER — Ambulatory Visit (HOSPITAL_BASED_OUTPATIENT_CLINIC_OR_DEPARTMENT_OTHER): Payer: Self-pay | Admitting: Family

## 2024-06-29 ENCOUNTER — Encounter: Payer: Self-pay | Admitting: *Deleted

## 2024-06-29 DIAGNOSIS — R9439 Abnormal result of other cardiovascular function study: Secondary | ICD-10-CM

## 2024-06-29 DIAGNOSIS — I2510 Atherosclerotic heart disease of native coronary artery without angina pectoris: Secondary | ICD-10-CM

## 2024-06-29 DIAGNOSIS — I25118 Atherosclerotic heart disease of native coronary artery with other forms of angina pectoris: Secondary | ICD-10-CM

## 2024-06-29 DIAGNOSIS — Z955 Presence of coronary angioplasty implant and graft: Secondary | ICD-10-CM

## 2024-06-29 DIAGNOSIS — Z0289 Encounter for other administrative examinations: Secondary | ICD-10-CM

## 2024-06-29 NOTE — Telephone Encounter (Signed)
 The patient has been notified of the result and verbalized understanding.  All questions (if any) were answered.  Pt aware I will place the order for the lexiscan in the system and have our scheduler for that dept reach out to him today to arrange this appt.   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   Pt aware I will mention this in the message to our Nuc Scheduler.   Pt is also aware I will send him the lexiscan instructions to his mychart account to review and refer to when the test is scheduled.   Pt verbalized understanding and agrees with this plan.

## 2024-06-29 NOTE — Telephone Encounter (Signed)
-----   Message from Reche GORMAN Finder sent at 06/29/2024 10:22 AM EDT ----- Stress test with good exercise capacity. However, there were abnormalities on the EKG. Recommend lexiscan myoview for further evaluation of possible ischemia.  ----- Message ----- From: Mona Vinie BROCKS, MD Sent: 06/28/2024   4:15 PM EDT To: Reche GORMAN Finder, NP

## 2024-06-30 ENCOUNTER — Ambulatory Visit (HOSPITAL_COMMUNITY)
Admission: RE | Admit: 2024-06-30 | Discharge: 2024-06-30 | Disposition: A | Discharge status: Home / Self Care | Source: Ambulatory Visit | Attending: Cardiology | Admitting: Cardiology

## 2024-06-30 ENCOUNTER — Other Ambulatory Visit (HOSPITAL_BASED_OUTPATIENT_CLINIC_OR_DEPARTMENT_OTHER): Payer: Self-pay | Admitting: Family

## 2024-06-30 DIAGNOSIS — R9439 Abnormal result of other cardiovascular function study: Secondary | ICD-10-CM | POA: Diagnosis present

## 2024-06-30 DIAGNOSIS — Z955 Presence of coronary angioplasty implant and graft: Secondary | ICD-10-CM

## 2024-06-30 DIAGNOSIS — I25118 Atherosclerotic heart disease of native coronary artery with other forms of angina pectoris: Secondary | ICD-10-CM

## 2024-06-30 DIAGNOSIS — I700 Atherosclerosis of aorta: Secondary | ICD-10-CM | POA: Diagnosis not present

## 2024-06-30 DIAGNOSIS — Z0289 Encounter for other administrative examinations: Secondary | ICD-10-CM | POA: Insufficient documentation

## 2024-06-30 DIAGNOSIS — I2510 Atherosclerotic heart disease of native coronary artery without angina pectoris: Secondary | ICD-10-CM | POA: Insufficient documentation

## 2024-06-30 LAB — MYOCARDIAL PERFUSION IMAGING
LV dias vol: 163 mL (ref 62–150)
LV sys vol: 68 mL (ref 4.2–5.8)
Nuc Stress EF: 58 %
Peak HR: 96 {beats}/min
Rest HR: 81 {beats}/min
Rest Nuclear Isotope Dose: 10.1 mCi
SDS: 4
SRS: 0
SSS: 4
Stress Nuclear Isotope Dose: 32.8 mCi
TID: 1.19

## 2024-06-30 MED ORDER — TECHNETIUM TC 99M TETROFOSMIN IV KIT
10.1000 | PACK | Freq: Once | INTRAVENOUS | Status: AC | PRN
  Administered 2024-06-30: 10.1 via INTRAVENOUS

## 2024-06-30 MED ORDER — REGADENOSON 0.4 MG/5ML IV SOLN
0.4000 mg | Freq: Once | INTRAVENOUS | Status: AC
  Administered 2024-06-30: 0.4 mg via INTRAVENOUS

## 2024-06-30 MED ORDER — TECHNETIUM TC 99M TETROFOSMIN IV KIT
32.8000 | PACK | Freq: Once | INTRAVENOUS | Status: AC | PRN
Start: 2024-06-30 — End: 2024-06-30
  Administered 2024-06-30: 32.8 via INTRAVENOUS

## 2024-06-30 MED ORDER — REGADENOSON 0.4 MG/5ML IV SOLN
INTRAVENOUS | Status: AC
Start: 2024-06-30 — End: 2024-06-30
  Filled 2024-06-30: qty 5

## 2024-07-01 ENCOUNTER — Telehealth (INDEPENDENT_AMBULATORY_CARE_PROVIDER_SITE_OTHER): Payer: Self-pay | Admitting: Family

## 2024-07-01 ENCOUNTER — Other Ambulatory Visit (HOSPITAL_BASED_OUTPATIENT_CLINIC_OR_DEPARTMENT_OTHER): Payer: Self-pay | Admitting: Family

## 2024-07-01 ENCOUNTER — Ambulatory Visit (HOSPITAL_BASED_OUTPATIENT_CLINIC_OR_DEPARTMENT_OTHER): Payer: Self-pay | Admitting: Family

## 2024-07-01 DIAGNOSIS — I25118 Atherosclerotic heart disease of native coronary artery with other forms of angina pectoris: Secondary | ICD-10-CM

## 2024-07-01 NOTE — Telephone Encounter (Addendum)
 Virtual Visit via Telephone Note   Because of Randall Mullen's co-morbid illnesses, he is at least at moderate risk for complications without adequate follow up.  This format is felt to be most appropriate for this patient at this time.  The patient did not have access to video technology/had technical difficulties with video requiring transitioning to audio format only (telephone).  All issues noted in this document were discussed and addressed.  No physical exam could be performed with this format.  Please refer to the patient's chart for his consent to telehealth for [facility].    Date:  07/01/2024   ID:  Randall Mullen, DOB December 12, 1970, MRN [id number] The patient was identified using 2 identifiers.  Patient Location: Home Provider Location: Office/Clinic   PCP:  Randall Boom, MD   [facility] Providers Cardiologist:  Randall Bruckner, MD     Evaluation Performed:  Follow-Up Visit  Chief Complaint:  Discuss abnormal myoview result  History of Present Illness:    Randall Mullen is a 53 y.o. male with  with a hx of HLD, CAD with NSTEMI and multivessel stent.   Prior NSTEMI and PCI 06/2019 with PTCA/DES to LAD, , posterolateral artery, prox RCA. Echo at the time with LVEF 55-60%, mild lVH, early diastolic dysfunction, no significant valvular abnormalities.    When seen 05/2020 Plavix was discontinued and Metoprolol  Tartrate consolidated to Metoprolol  Succinate.    At visit 05/25/2023 atorvastatin  reduced from 80 mg to 40 mg.  He was tapered off metoprolol . Seen 12/2023 doing well. At visit 05/2024 Valsartan  reduced to 40mg  daily due to hypotension. ETT for DOT clearance ordered and abnormal. Subsequent myoview 06/30/24 with normal LVEF but small, mild area of reduction uptake in mid to basal inferolateral location consistent with ischemia.   Follow up today via phone. Reports no shortness of breath nor dyspnea on exertion. Reports no chest pain,  pressure, or tightness. No edema, orthopnea, PND. Reports no palpitations.  Reviewed myoview result, given abnormal result and strong family history of CAD plan for further ischemic evaluation. 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 Continues to exercise regularly with combination of cardio and strength training.    Past Medical History:  Diagnosis Date   GERD (gastroesophageal reflux disease)    Gout    HLD (hyperlipidemia)    Past Surgical History:  Procedure Laterality Date   CORONARY STENT INTERVENTION N/A 06/30/2019   Procedure: CORONARY STENT INTERVENTION;  Surgeon: Randall Mullen BIRCH, MD;  Location: [facility] INVASIVE CV LAB;  Service: Cardiovascular;  Laterality: N/A;   LEFT HEART CATH AND CORONARY ANGIOGRAPHY N/A 06/29/2019   Procedure: LEFT HEART CATH AND CORONARY ANGIOGRAPHY;  Surgeon: Randall Mullen BIRCH, MD;  Location: [facility] INVASIVE CV LAB;  Service: Cardiovascular;  Laterality: N/A;   VENTRAL HERNIA REPAIR       No outpatient medications have been marked as taking for the 07/01/24 encounter (Telephone) with Randall Reche RAMAN, NP.     Allergies:   Patient has no known allergies.   Social History   Tobacco Use   Smoking status: Never   Smokeless tobacco: Never  Vaping Use   Vaping status: Never Used  Substance Use Topics   Alcohol use: Yes   Drug use: Never     Family Hx: The patient's family history includes  CAD in his father; Heart attack in his paternal grandmother.  ROS:   Please see the history of present illness.     All other systems reviewed and are negative.   Prior CV studies:   The following studies were reviewed today:  Cardiac Studies & Procedures   ______________________________________________________________________________________________ CARDIAC CATHETERIZATION  CARDIAC  CATHETERIZATION 06/30/2019  Conclusion  Prox LAD lesion is 99% stenosed.  Ost Cx to Prox Cx lesion is 50% stenosed.  Mid Cx to Dist Cx lesion is 100% stenosed.  Prox RCA lesion is 80% stenosed.  RPAV lesion is 99% stenosed.  A drug-eluting stent was successfully placed using a STENT RESOLUTE ONYX 3.5X26.  Post intervention, there is a 0% residual stenosis.  A drug-eluting stent was successfully placed using a STENT RESOLUTE ONYX 2.25X30.  Post intervention, there is a 0% residual stenosis.  A drug-eluting stent was successfully placed using a STENT RESOLUTE ONYX 4.0X18.  Post intervention, there is a 0% residual stenosis.  1. Successful PTCA/DES x 1 proximal LAD 2. Successful PTCA/DES x 1 posterolateral artery 3. Successful PTCA/DES x 1 proximal RCA  Will continue DAPT with ASA and Brilinta  for one year. Continue statin and beta blocker. Likely d/c home tomorrow after hydration post dye load. BMET in am.  Findings Coronary Findings Diagnostic  Dominance: Right  Left Anterior Descending Prox LAD lesion is 99% stenosed.  Left Circumflex Ost Cx to Prox Cx lesion is 50% stenosed. Mid Cx to Dist Cx lesion is 100% stenosed. The lesion is chronically occluded.  Third Obtuse Marginal Branch Collaterals 3rd Mrg filled by collaterals from 2nd Mrg.  Right Coronary Artery Vessel is large. Prox RCA lesion is 80% stenosed.  Right Posterior Atrioventricular Artery RPAV lesion is 99% stenosed.  Intervention  Prox LAD lesion Stent CATH VISTA GUIDE 6FR XBLAD3.5 guide catheter was inserted. Lesion crossed with guidewire using a WIRE COUGAR XT STRL 190CM. Pre-stent angioplasty was performed using a BALLOON SAPPHIRE 2.0X15. A drug-eluting stent was successfully placed using a STENT RESOLUTE ONYX 3.5X26. Post-stent angioplasty was performed using a BALLOON SAPPHIRE [place] [id number]. Post-Intervention Lesion Assessment The intervention was successful. Pre-interventional TIMI flow is 3.  Post-intervention TIMI flow is 3. No complications occurred at this lesion. There is a 0% residual stenosis post intervention.  Prox RCA lesion Stent CATH VISTA GUIDE 6FR JR4 guide catheter was inserted. Pre-stent angioplasty was performed using a BALLOON SAPPHIRE 2.0X15. A drug-eluting stent was successfully placed using a STENT RESOLUTE ONYX 4.0X18. Stent strut is well apposed. Post-stent angioplasty was performed using a BALLOON SAPPHIRE [place] 4.0X15. Post-Intervention Lesion Assessment The intervention was successful. Pre-interventional TIMI flow is 3. Post-intervention TIMI flow is 3. No complications occurred at this lesion. There is a 0% residual stenosis post intervention.  RPAV lesion Stent CATH VISTA GUIDE 6FR JR4 guide catheter was inserted. Lesion crossed with guidewire using a WIRE COUGAR XT STRL 190CM. Pre-stent angioplasty was performed using a BALLOON SAPPHIRE 2.0X15. A drug-eluting stent was successfully placed using a STENT RESOLUTE ONYX 2.25X30. Post-stent angioplasty was performed using a BALLOON SAPPHIRE [place] 2.5X15. Post-Intervention Lesion Assessment The intervention was successful. Pre-interventional TIMI flow is 3. Post-intervention TIMI flow is 3. No complications occurred at this lesion. There is a 0% residual stenosis post intervention.   CARDIAC CATHETERIZATION 06/29/2019  Conclusion  Prox RCA lesion is 80% stenosed.  RPAV lesion is 99% stenosed.  Ost Cx to Prox Cx lesion is 50% stenosed.  Prox LAD lesion is 99% stenosed.  Mid Cx to Dist Cx lesion  is 100% stenosed.  1. Severe three vessel CAD 2. Severe stenosis proximal LAD. The mid and distal LAD fills briskly from antegrade flow but also seen to fill from faint right to left collaterals. 3. Moderate disease in the proximal Circumflex artery with chronic occlusion of the mid Circumflex just after the takeoff of the large obtuse marginal branch 4. Severe focal stenosis in the proximal segment of the large  dominant RCA. Severe stenosis in the the proximal segment of the moderate caliber posterolateral artery 5. Normal filling pressures.  Recommendations: Options for treatment of multi-vessel CAD include CABG vs multi-vessel stenting. Given his young age and focal nature of the disease in his RCA and LAD, I feel that stenting is the best long term option for treatment.  I have reviewed with my interventional colleagues this am and they agree. I have discussed this with the patient and his wife and they agree. Will load with Brilinta  180 mg po x 1 today. Plan PCI of the RCA and LAD tomorrow.  Findings Coronary Findings Diagnostic  Dominance: Right  Left Anterior Descending Prox LAD lesion is 99% stenosed.  Left Circumflex Ost Cx to Prox Cx lesion is 50% stenosed. Mid Cx to Dist Cx lesion is 100% stenosed. The lesion is chronically occluded.  Third Obtuse Marginal Branch Collaterals 3rd Mrg filled by collaterals from 2nd Mrg.  Right Coronary Artery Vessel is large. Prox RCA lesion is 80% stenosed.  Right Posterior Atrioventricular Artery RPAV lesion is 99% stenosed.  Intervention  No interventions have been documented.   STRESS TESTS  MYOCARDIAL PERFUSION IMAGING 06/30/2024  Interpretation Summary   LV perfusion is abnormal. There is evidence of ischemia. There is no evidence of infarction. Defect 1: There is a small defect with mild reduction in uptake present in the mid to basal inferolateral location(s) that is reversible. There is normal wall motion in the defect area. Consistent with ischemia.   Left ventricular function is normal. Nuclear stress EF: 58%. The left ventricular ejection fraction is normal (55-65%). End diastolic cavity size is normal.   Coronary calcium  assessment not performed due to prior revascularization.   Findings are consistent with ischemia. The study is low risk.   ECHOCARDIOGRAM  ECHOCARDIOGRAM LIMITED 08/17/2019  Narrative ECHOCARDIOGRAM LIMITED  REPORT    Patient Name:   Randall Mullen Date of Exam: 08/17/2019 Medical Rec #:  [id number]          Height:       75.0 in Accession #:    [id number]         Weight:       270.0 lb Date of Birth:  01/01/1971         BSA:          2.49 m Patient Age:    53 years           BP:           114/73 mmHg Patient Gender: M                  HR:           101 bpm. Exam Location:  Inpatient   Procedure: Limited Echo, Limited Color Doppler and Color Doppler  Indications:    Abnormal ECG R94.31  History:        Patient has prior history of Echocardiogram examinations, most recent 06/30/2019.  Sonographer:    Augustin Seals RDCS (AE) Referring Phys: [id number] [facility]  IMPRESSIONS   1. Left ventricular ejection  fraction, by visual estimation, is 60 to 65%. The left ventricle has normal function. There is mildly increased left ventricular hypertrophy. 2. Global right ventricle has normal systolic function.The right ventricular size is mildly enlarged. 3. Left atrial size was normal. 4. Right atrial size was normal. 5. The mitral valve is normal in structure. No evidence of mitral valve regurgitation. 6. The tricuspid valve is normal in structure. Tricuspid valve regurgitation is not demonstrated. 7. The aortic valve is tricuspid. Aortic valve regurgitation is not visualized. 8. The pulmonic valve was not well visualized. Pulmonic valve regurgitation not assessed. 9. TR signal is inadequate for assessing pulmonary artery systolic pressure.  FINDINGS Left Ventricle: Left ventricular ejection fraction, by visual estimation, is 60 to 65%. The left ventricle has normal function. The left ventricular internal cavity size was the left ventricle is normal in size. There is mildly increased left ventricular hypertrophy. Left ventricular diastolic parameters were normal.  Right Ventricle: The right ventricular size is mildly enlarged. No increase in right ventricular wall thickness. Global RV  systolic function is has normal systolic function.  Left Atrium: Left atrial size was normal in size.  Right Atrium: Right atrial size was normal in size  Pericardium: There is no evidence of pericardial effusion.  Mitral Valve: The mitral valve is normal in structure. MV Area by PHT, 2.13 cm. MV PHT, 103.24 msec. No evidence of mitral valve regurgitation.  Tricuspid Valve: The tricuspid valve is normal in structure. Tricuspid valve regurgitation is not demonstrated.  Aortic Valve: The aortic valve is tricuspid. Aortic valve regurgitation is not visualized.  Pulmonic Valve: The pulmonic valve was not well visualized. Pulmonic valve regurgitation not assessed.  Aorta: The aortic root is normal in size and structure.  Shunts: The interatrial septum was not well visualized.   LEFT VENTRICLE         Normals PLAX 2D LVIDd:         5.80 cm 3.6 cm   Diastology                  Normals LVIDs:         3.80 cm 1.7 cm   LV e' lateral:   13.60 cm/s 6.42 cm/s LV PW:         1.00 cm 1.4 cm   LV E/e' lateral: 4.7        15.4 LV IVS:        0.90 cm 1.3 cm   LV e' medial:    10.90 cm/s 6.96 cm/s LV SV:         105 ml  79 ml    LV E/e' medial:  5.8        6.96 LV SV Index:   40.54   45 ml/m2   LEFT ATRIUM             Index       RIGHT ATRIUM           Index LA diam:        4.70 cm 1.89 cm/m  RA Area:     21.30 cm LA Vol (A2C):   75.8 ml 30.41 ml/m RA Volume:   71.70 ml  28.76 ml/m LA Vol (A4C):   48.1 ml 19.29 ml/m LA Biplane Vol: 67.1 ml 26.92 ml/m  AORTA                 Normals Ao Root diam: 3.70 cm 31 mm  MITRAL VALVE  Normals MV Area (PHT): 2.13 cm MV PHT:        103.24 msec 55 ms MV Decel Time: 356 msec    187 ms MV E velocity: 63.40 cm/s 103 cm/s MV A velocity: 53.60 cm/s 70.3 cm/s MV E/A ratio:  1.18       1.5   Lonni Nanas MD Electronically signed by Lonni Nanas MD Signature Date/Time: 08/17/2019/2:04:53 PM    Final           ______________________________________________________________________________________________       Labs/Other Tests and Data Reviewed:    EKG:  An ECG dated 05/30/24 was personally reviewed today and demonstrated:  NSR 83 bpm with PVC and stable lateral TWI  Recent Labs: 01/08/2024: ALT 19; BUN 17; Creatinine, Ser 1.22; Potassium 4.4; Sodium 142   Recent Lipid Panel Lab Results  Component Value Date/Time   CHOL 103 01/08/2024 11:47 AM   TRIG 131 01/08/2024 11:47 AM   HDL 41 01/08/2024 11:47 AM   CHOLHDL 2.5 01/08/2024 11:47 AM   LDLCALC 39 01/08/2024 11:47 AM    Wt Readings from Last 3 Encounters:  05/30/24 259 lb (117.5 kg)  01/08/24 256 lb (116.1 kg)  05/25/23 263 lb 6.4 oz (119.5 kg)     Risk Assessment/Calculations:      STOP-Bang Score:  2      Objective:    Vital Signs:  There were no vitals taken for this visit.    ASSESSMENT & PLAN:    CAD/HLD, LDL goal 70- Prior NSTEMI and PCI 06/2019 with PTCA/DES to LAD, posterolateral artery, prox RCA. Abnormal ETT and myoview. No chest pain, dyspnea. Given abnormal myoview, plan for ischemic evaluation with LHC with regular hydration per protocols.  BMET/CBC Monday at [facility] lab Aware to hold Ozempic  07/06/24 until after LHC  HTN- Continue Valsartan  40mg  daily. Discussed to monitor BP at home at least 2 hours after medications and sitting for 5-10 minutes.   DM2 / Obesity - Congratulated on weight loss through lifestyle changes and Ozempic . Continue Ozempic  2mg  weekly. He is aware to hold Ozempic   07/06/24 until after [facility], usually takes on Mondays.   Syncope - prior syncope with lab draws consistent with vasovagal. No indication for cardiac workup. No recurrence.   Sleep apnea- diagnosis many years ago. Has not worn CPAP in some time. Since weight loss: no snoring, no daytime somnolence. No indication for CPAP at this time.       Informed Consent   Shared Decision Making/Informed Consent The risks [stroke (1  in 1000), death (1 in 1000), kidney failure [usually temporary] (1 in 500), bleeding (1 in 200), allergic reaction [possibly serious] (1 in 200)], benefits (diagnostic support and management of coronary artery disease) and alternatives of a cardiac catheterization were discussed in detail with Randall Mullen and he is willing to proceed.        Time:   Today, I have spent 10 minutes with the patient with telehealth technology discussing the above problems.     Medication Adjustments/Labs and Tests Ordered: Current medicines are reviewed at length with the patient today.  Concerns regarding medicines are outlined above.   Tests Ordered: Orders Placed This Encounter  Procedures   Basic metabolic panel with GFR   CBC    Medication Changes: No orders of the defined types were placed in this encounter.   Follow Up:  2-3 weeks after LHC   Signed, Reche GORMAN Finder, NP  07/01/2024 12:38 PM    [facility]

## 2024-07-01 NOTE — Telephone Encounter (Signed)
 Reviewed w APP - no availability with Dr. Verlin on 07/06/24.  Pper APP, patient has the choice of waiting until next cath day for [facility] of 07/20/24, or he can come in 07/07/24 with Dr. [place].  Called and spoke w patient.  He is interested in 07/07/24.  We reviewed instructions and they have been sent to him in the portal.   Scheduled 9:00 1023/25 Dr. [place] CAD, abnormal myoview Message to precert.  Will need H&P updated and will need EKG at short stay Labs at [facility] Monday. Aware that per [facility] he is to hold Ozempic  dose on 07/04/24.  SABRA

## 2024-07-01 NOTE — Telephone Encounter (Signed)
 See phone encounter 07/01/24.   Randall Yassin S Herberta Pickron, NP

## 2024-07-04 LAB — CBC
Hematocrit: 45.9 % (ref 37.5–51.0)
Hemoglobin: 14.6 g/dL (ref 13.0–17.7)
MCH: 29.7 pg (ref 26.6–33.0)
MCHC: 31.8 g/dL (ref 31.5–35.7)
MCV: 94 fL (ref 79–97)
Platelets: 208 x10E3/uL (ref 150–450)
RBC: 4.91 x10E6/uL (ref 4.14–5.80)
RDW: 13 % (ref 11.6–15.4)
WBC: 7.1 x10E3/uL (ref 3.4–10.8)

## 2024-07-04 LAB — BASIC METABOLIC PANEL WITH GFR
BUN/Creatinine Ratio: 10 (ref 9–20)
BUN: 11 mg/dL (ref 6–24)
CO2: 23 mmol/L (ref 20–29)
Calcium: 9.3 mg/dL (ref 8.7–10.2)
Chloride: 104 mmol/L (ref 96–106)
Creatinine, Ser: 1.14 mg/dL (ref 0.76–1.27)
Glucose: 90 mg/dL (ref 70–99)
Potassium: 4.1 mmol/L (ref 3.5–5.2)
Sodium: 141 mmol/L (ref 134–144)
eGFR: 78 mL/min/1.73 (ref >59)

## 2024-07-05 ENCOUNTER — Ambulatory Visit (HOSPITAL_BASED_OUTPATIENT_CLINIC_OR_DEPARTMENT_OTHER): Payer: Self-pay | Admitting: Family

## 2024-07-05 ENCOUNTER — Telehealth: Payer: Self-pay | Admitting: *Deleted

## 2024-07-05 NOTE — Telephone Encounter (Signed)
 Cardiac Catheterization scheduled at [facility] for: Thursday July 07, 2024 9 AM Arrival time [facility] Main Entrance A at: 7 AM  Diet: -Nothing to eat after midnight.  Hydration: -May drink clear liquids until 2 hours before the procedure.  Approved liquids: Water, clear tea, black coffee, fruit juices-non-citric and without pulp,Gatorade, plain Jello/popsicles.   -Please drink 16 oz of water 2 hours before procedure.  Medication instructions: -Usual morning medications can be taken including aspirin  81 mg.  Pt tells me he takes Ozempic  weekly on Mondays-did not take this past Monday  Plan to go home the same day, you will only stay overnight if medically necessary.  You must have responsible adult to drive you home.  Someone must be with you the first 24 hours after you arrive home.  Reviewed procedure instructions with patient.

## 2024-07-07 ENCOUNTER — Other Ambulatory Visit: Payer: Self-pay

## 2024-07-07 ENCOUNTER — Encounter (HOSPITAL_COMMUNITY): Payer: Self-pay | Admitting: Cardiology

## 2024-07-07 ENCOUNTER — Ambulatory Visit (HOSPITAL_COMMUNITY)
Admission: RE | Admit: 2024-07-07 | Discharge: 2024-07-07 | Disposition: A | Discharge status: Home / Self Care | Attending: Cardiology | Admitting: Cardiology

## 2024-07-07 ENCOUNTER — Encounter (HOSPITAL_COMMUNITY)
Admission: RE | Disposition: A | Discharge status: Home / Self Care | Payer: Self-pay | Source: Home / Self Care | Attending: Cardiology

## 2024-07-07 DIAGNOSIS — I252 Old myocardial infarction: Secondary | ICD-10-CM | POA: Diagnosis not present

## 2024-07-07 DIAGNOSIS — G4730 Sleep apnea, unspecified: Secondary | ICD-10-CM | POA: Insufficient documentation

## 2024-07-07 DIAGNOSIS — I2510 Atherosclerotic heart disease of native coronary artery without angina pectoris: Secondary | ICD-10-CM | POA: Diagnosis not present

## 2024-07-07 DIAGNOSIS — E785 Hyperlipidemia, unspecified: Secondary | ICD-10-CM | POA: Diagnosis not present

## 2024-07-07 DIAGNOSIS — R9439 Abnormal result of other cardiovascular function study: Secondary | ICD-10-CM | POA: Diagnosis present

## 2024-07-07 DIAGNOSIS — Z7985 Long-term (current) use of injectable non-insulin antidiabetic drugs: Secondary | ICD-10-CM | POA: Insufficient documentation

## 2024-07-07 DIAGNOSIS — E669 Obesity, unspecified: Secondary | ICD-10-CM | POA: Insufficient documentation

## 2024-07-07 DIAGNOSIS — Z79899 Other long term (current) drug therapy: Secondary | ICD-10-CM | POA: Diagnosis not present

## 2024-07-07 DIAGNOSIS — E119 Type 2 diabetes mellitus without complications: Secondary | ICD-10-CM | POA: Diagnosis not present

## 2024-07-07 DIAGNOSIS — Z955 Presence of coronary angioplasty implant and graft: Secondary | ICD-10-CM | POA: Insufficient documentation

## 2024-07-07 DIAGNOSIS — Z6831 Body mass index (BMI) 31.0-31.9, adult: Secondary | ICD-10-CM | POA: Insufficient documentation

## 2024-07-07 DIAGNOSIS — I10 Essential (primary) hypertension: Secondary | ICD-10-CM | POA: Insufficient documentation

## 2024-07-07 HISTORY — PX: LEFT HEART CATH AND CORONARY ANGIOGRAPHY: CATH118249

## 2024-07-07 SURGERY — LEFT HEART CATH AND CORONARY ANGIOGRAPHY
Anesthesia: LOCAL

## 2024-07-07 MED ORDER — HEPARIN SODIUM (PORCINE) 1000 UNIT/ML IJ SOLN
INTRAMUSCULAR | Status: DC | PRN
  Administered 2024-07-07: 5000 [IU] via INTRAVENOUS

## 2024-07-07 MED ORDER — ASPIRIN 81 MG PO CHEW: 81.0000 mg | CHEWABLE_TABLET | ORAL | Status: DC

## 2024-07-07 MED ORDER — IOHEXOL 350 MG/ML SOLN
INTRAVENOUS | Status: DC | PRN
  Administered 2024-07-07: 50 mL

## 2024-07-07 MED ORDER — HEPARIN SODIUM (PORCINE) 1000 UNIT/ML IJ SOLN
INTRAMUSCULAR | Status: AC
  Filled 2024-07-07: qty 10

## 2024-07-07 MED ORDER — SODIUM CHLORIDE 0.9 % IV SOLN: 250.0000 mL | INTRAVENOUS | Status: DC | PRN

## 2024-07-07 MED ORDER — MIDAZOLAM HCL 2 MG/2ML IJ SOLN
INTRAMUSCULAR | Status: AC
  Filled 2024-07-07: qty 2

## 2024-07-07 MED ORDER — LIDOCAINE HCL (PF) 1 % IJ SOLN
INTRAMUSCULAR | Status: AC
  Filled 2024-07-07: qty 30

## 2024-07-07 MED ORDER — VERAPAMIL HCL 2.5 MG/ML IV SOLN
INTRAVENOUS | Status: AC
  Filled 2024-07-07: qty 2

## 2024-07-07 MED ORDER — SODIUM CHLORIDE 0.9 % IV SOLN
INTRAVENOUS | Status: DC | PRN
  Administered 2024-07-07: 10 mL/h via INTRAVENOUS

## 2024-07-07 MED ORDER — LIDOCAINE HCL (PF) 1 % IJ SOLN
INTRAMUSCULAR | Status: DC | PRN
  Administered 2024-07-07: 2 mL

## 2024-07-07 MED ORDER — VERAPAMIL HCL 2.5 MG/ML IV SOLN
INTRAVENOUS | Status: DC | PRN
  Administered 2024-07-07: 10 mL via INTRA_ARTERIAL

## 2024-07-07 MED ORDER — HEPARIN (PORCINE) IN NACL 1000-0.9 UT/500ML-% IV SOLN
INTRAVENOUS | Status: DC | PRN
  Administered 2024-07-07 (×2): 500 mL

## 2024-07-07 MED ORDER — SODIUM CHLORIDE 0.9% FLUSH: 3.0000 mL | Freq: Two times a day (BID) | INTRAVENOUS | Status: DC

## 2024-07-07 MED ORDER — SODIUM CHLORIDE 0.9% FLUSH: 3.0000 mL | INTRAVENOUS | Status: DC | PRN

## 2024-07-07 MED ORDER — FREE WATER: 500.0000 mL | Freq: Once | Status: DC

## 2024-07-07 MED ORDER — MIDAZOLAM HCL (PF) 2 MG/2ML IJ SOLN
INTRAMUSCULAR | Status: DC | PRN
  Administered 2024-07-07: 1 mg via INTRAVENOUS

## 2024-07-07 MED ORDER — FENTANYL CITRATE (PF) 100 MCG/2ML IJ SOLN
INTRAMUSCULAR | Status: AC
  Filled 2024-07-07: qty 2

## 2024-07-07 MED ORDER — FENTANYL CITRATE (PF) 100 MCG/2ML IJ SOLN
INTRAMUSCULAR | Status: DC | PRN
  Administered 2024-07-07: 25 ug via INTRAVENOUS

## 2024-07-07 SURGICAL SUPPLY — 8 items
CATH 5FR JL3.5 JR4 ANG PIG MP (CATHETERS) IMPLANT
DEVICE RAD COMP TR BAND LRG (VASCULAR PRODUCTS) IMPLANT
GLIDESHEATH SLEND SS 6F .021 (SHEATH) IMPLANT
GUIDEWIRE INQWIRE 1.5J.035X260 (WIRE) IMPLANT
KIT SYRINGE INJ CVI SPIKEX1 (MISCELLANEOUS) IMPLANT
PACK CARDIAC CATHETERIZATION (CUSTOM PROCEDURE TRAY) ×1 IMPLANT
SET ATX-X65L (MISCELLANEOUS) IMPLANT
SHEATH PROBE COVER 6X72 (BAG) IMPLANT

## 2024-07-07 NOTE — H&P (Signed)
 Date:  07/01/2024    ID:  Randall Mullen, DOB 06/13/1971, MRN [id number] The patient was identified using 2 identifiers.   Patient Location: Home Provider Location: Office/Clinic     PCP:  Lennie Boom, MD              [facility] Providers Cardiologist:  Shelda Bruckner, MD      Evaluation Performed:  Follow-Up Visit   Chief Complaint:  Discuss abnormal myoview result   History of Present Illness:     Randall Mullen is a 53 y.o. male with  with a hx of HLD, CAD with NSTEMI and multivessel stent.   Prior NSTEMI and PCI 06/2019 with PTCA/DES to LAD, , posterolateral artery, prox RCA. Echo at the time with LVEF 55-60%, mild lVH, early diastolic dysfunction, no significant valvular abnormalities.    When seen 05/2020 Plavix was discontinued and Metoprolol  Tartrate consolidated to Metoprolol  Succinate.    At visit 05/25/2023 atorvastatin  reduced from 80 mg to 40 mg.  He was tapered off metoprolol . Seen 12/2023 doing well. At visit 05/2024 Valsartan  reduced to 40mg  daily due to hypotension. ETT for DOT clearance ordered and abnormal. Subsequent myoview 06/30/24 with normal LVEF but small, mild area of reduction uptake in mid to basal inferolateral location consistent with ischemia.    Follow up today via phone. Reports no shortness of breath nor dyspnea on exertion. Reports no chest pain, pressure, or tightness. No edema, orthopnea, PND. Reports no palpitations.  Reviewed myoview result, given abnormal result and strong family history of CAD plan for further ischemic evaluation. 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 Continues to exercise regularly with combination of cardio and strength training.          Past Medical History:  Diagnosis Date   GERD (gastroesophageal reflux disease)     Gout     HLD  (hyperlipidemia)               Past Surgical History:  Procedure Laterality Date   CORONARY STENT INTERVENTION N/A 06/30/2019    Procedure: CORONARY STENT INTERVENTION;  Surgeon: Verlin Bruckner BIRCH, MD;  Location: [facility];  Service: Cardiovascular;  Laterality: N/A;   LEFT HEART CATH AND CORONARY ANGIOGRAPHY N/A 06/29/2019    Procedure: LEFT HEART CATH AND CORONARY ANGIOGRAPHY;  Surgeon: Verlin Bruckner BIRCH, MD;  Location: [facility];  Service: Cardiovascular;  Laterality: N/A;   VENTRAL HERNIA REPAIR              Active Medications  No outpatient medications have been marked as taking for the 07/01/24 encounter (Telephone) with Randall Reche RAMAN, NP.        Allergies:   Patient has no known allergies.    Social History  Social History        Tobacco Use   Smoking status: Never   Smokeless tobacco: Never  Vaping Use   Vaping status: Never Used  Substance Use Topics   Alcohol use: Yes   Drug use: Never        Family Hx: The patient's family history includes CAD in his father; Heart attack in his paternal grandmother.   ROS:   Please see the history of present illness.      All other systems reviewed and are negative.  Prior CV studies:   The following studies were reviewed today:   Cardiac Studies & Procedures Objective  ______________________________________________________________________________________________ CARDIAC CATHETERIZATION   CARDIAC CATHETERIZATION 06/30/2019   Conclusion  Prox LAD lesion is 99% stenosed.  Ost Cx to Prox Cx lesion is 50% stenosed.  Mid Cx to Dist Cx lesion is 100% stenosed.  Prox RCA lesion is 80% stenosed.  RPAV lesion is 99% stenosed.  A drug-eluting stent was successfully placed using a STENT RESOLUTE ONYX 3.5X26.  Post intervention, there is a 0% residual stenosis.  A drug-eluting stent was successfully placed using a STENT RESOLUTE ONYX 2.25X30.  Post intervention, there is a  0% residual stenosis.  A drug-eluting stent was successfully placed using a STENT RESOLUTE ONYX 4.0X18.  Post intervention, there is a 0% residual stenosis.   1. Successful PTCA/DES x 1 proximal LAD 2. Successful PTCA/DES x 1 posterolateral artery 3. Successful PTCA/DES x 1 proximal RCA   Will continue DAPT with ASA and Brilinta  for one year. Continue statin and beta blocker. Likely d/c home tomorrow after hydration post dye load. BMET in am.   Findings Coronary Findings Diagnostic  Dominance: Right   Left Anterior Descending Prox LAD lesion is 99% stenosed.   Left Circumflex Ost Cx to Prox Cx lesion is 50% stenosed. Mid Cx to Dist Cx lesion is 100% stenosed. The lesion is chronically occluded.   Third Obtuse Marginal Branch Collaterals 3rd Mrg filled by collaterals from 2nd Mrg.   Right Coronary Artery Vessel is large. Prox RCA lesion is 80% stenosed.   Right Posterior Atrioventricular Artery RPAV lesion is 99% stenosed.   Intervention   Prox LAD lesion Stent CATH VISTA GUIDE 6FR XBLAD3.5 guide catheter was inserted. Lesion crossed with guidewire using a WIRE COUGAR XT STRL 190CM. Pre-stent angioplasty was performed using a BALLOON SAPPHIRE 2.0X15. A drug-eluting stent was successfully placed using a STENT RESOLUTE ONYX 3.5X26. Post-stent angioplasty was performed using a BALLOON SAPPHIRE [place] [id number]. Post-Intervention Lesion Assessment The intervention was successful. Pre-interventional TIMI flow is 3. Post-intervention TIMI flow is 3. No complications occurred at this lesion. There is a 0% residual stenosis post intervention.   Prox RCA lesion Stent CATH VISTA GUIDE 6FR JR4 guide catheter was inserted. Pre-stent angioplasty was performed using a BALLOON SAPPHIRE 2.0X15. A drug-eluting stent was successfully placed using a STENT RESOLUTE ONYX 4.0X18. Stent strut is well apposed. Post-stent angioplasty was performed using a BALLOON SAPPHIRE [place] 4.0X15. Post-Intervention  Lesion Assessment The intervention was successful. Pre-interventional TIMI flow is 3. Post-intervention TIMI flow is 3. No complications occurred at this lesion. There is a 0% residual stenosis post intervention.   RPAV lesion Stent CATH VISTA GUIDE 6FR JR4 guide catheter was inserted. Lesion crossed with guidewire using a WIRE COUGAR XT STRL 190CM. Pre-stent angioplasty was performed using a BALLOON SAPPHIRE 2.0X15. A drug-eluting stent was successfully placed using a STENT RESOLUTE ONYX 2.25X30. Post-stent angioplasty was performed using a BALLOON SAPPHIRE [place] 2.5X15. Post-Intervention Lesion Assessment The intervention was successful. Pre-interventional TIMI flow is 3. Post-intervention TIMI flow is 3. No complications occurred at this lesion. There is a 0% residual stenosis post intervention.     CARDIAC CATHETERIZATION 06/29/2019   Conclusion  Prox RCA lesion is 80% stenosed.  RPAV lesion is 99% stenosed.  Ost Cx to Prox Cx lesion is 50% stenosed.  Prox LAD lesion is 99% stenosed.  Mid Cx to Dist Cx lesion is 100% stenosed.   1. Severe three vessel CAD 2. Severe stenosis proximal LAD. The  mid and distal LAD fills briskly from antegrade flow but also seen to fill from faint right to left collaterals. 3. Moderate disease in the proximal Circumflex artery with chronic occlusion of the mid Circumflex just after the takeoff of the large obtuse marginal branch 4. Severe focal stenosis in the proximal segment of the large dominant RCA. Severe stenosis in the the proximal segment of the moderate caliber posterolateral artery 5. Normal filling pressures.   Recommendations: Options for treatment of multi-vessel CAD include CABG vs multi-vessel stenting. Given his young age and focal nature of the disease in his RCA and LAD, I feel that stenting is the best long term option for treatment.  I have reviewed with my interventional colleagues this am and they agree. I have discussed this with  the patient and his wife and they agree. Will load with Brilinta  180 mg po x 1 today. Plan PCI of the RCA and LAD tomorrow.   Findings Coronary Findings Diagnostic  Dominance: Right   Left Anterior Descending Prox LAD lesion is 99% stenosed.   Left Circumflex Ost Cx to Prox Cx lesion is 50% stenosed. Mid Cx to Dist Cx lesion is 100% stenosed. The lesion is chronically occluded.   Third Obtuse Marginal Branch Collaterals 3rd Mrg filled by collaterals from 2nd Mrg.   Right Coronary Artery Vessel is large. Prox RCA lesion is 80% stenosed.   Right Posterior Atrioventricular Artery RPAV lesion is 99% stenosed.   Intervention   No interventions have been documented.     STRESS TESTS   MYOCARDIAL PERFUSION IMAGING 06/30/2024   Interpretation Summary   LV perfusion is abnormal. There is evidence of ischemia. There is no evidence of infarction. Defect 1: There is a small defect with mild reduction in uptake present in the mid to basal inferolateral location(s) that is reversible. There is normal wall motion in the defect area. Consistent with ischemia.   Left ventricular function is normal. Nuclear stress EF: 58%. The left ventricular ejection fraction is normal (55-65%). End diastolic cavity size is normal.   Coronary calcium  assessment not performed due to prior revascularization.   Findings are consistent with ischemia. The study is low risk.   ECHOCARDIOGRAM   ECHOCARDIOGRAM LIMITED 08/17/2019   Narrative ECHOCARDIOGRAM LIMITED REPORT       Patient Name:   KANI CHAUVIN Date of Exam: 08/17/2019 Medical Rec #:  [id number]          Height:       75.0 in Accession #:    [id number]         Weight:       270.0 lb Date of Birth:  07/26/1971         BSA:          2.49 m Patient Age:    48 years           BP:           114/73 mmHg Patient Gender: M                  HR:           101 bpm. Exam Location:  Inpatient     Procedure: Limited Echo, Limited Color Doppler and  Color Doppler   Indications:    Abnormal ECG R94.31   History:        Patient has prior history of Echocardiogram examinations, most recent 06/30/2019.   Sonographer:    Augustin Seals RDCS (AE) Referring Phys: [id number] RENATO APPLEBAUM  IMPRESSIONS     1. Left ventricular ejection fraction, by visual estimation, is 60 to 65%. The left ventricle has normal function. There is mildly increased left ventricular hypertrophy. 2. Global right ventricle has normal systolic function.The right ventricular size is mildly enlarged. 3. Left atrial size was normal. 4. Right atrial size was normal. 5. The mitral valve is normal in structure. No evidence of mitral valve regurgitation. 6. The tricuspid valve is normal in structure. Tricuspid valve regurgitation is not demonstrated. 7. The aortic valve is tricuspid. Aortic valve regurgitation is not visualized. 8. The pulmonic valve was not well visualized. Pulmonic valve regurgitation not assessed. 9. TR signal is inadequate for assessing pulmonary artery systolic pressure.   FINDINGS Left Ventricle: Left ventricular ejection fraction, by visual estimation, is 60 to 65%. The left ventricle has normal function. The left ventricular internal cavity size was the left ventricle is normal in size. There is mildly increased left ventricular hypertrophy. Left ventricular diastolic parameters were normal.   Right Ventricle: The right ventricular size is mildly enlarged. No increase in right ventricular wall thickness. Global RV systolic function is has normal systolic function.   Left Atrium: Left atrial size was normal in size.   Right Atrium: Right atrial size was normal in size   Pericardium: There is no evidence of pericardial effusion.   Mitral Valve: The mitral valve is normal in structure. MV Area by PHT, 2.13 cm. MV PHT, 103.24 msec. No evidence of mitral valve regurgitation.   Tricuspid Valve: The tricuspid valve is normal in structure.  Tricuspid valve regurgitation is not demonstrated.   Aortic Valve: The aortic valve is tricuspid. Aortic valve regurgitation is not visualized.   Pulmonic Valve: The pulmonic valve was not well visualized. Pulmonic valve regurgitation not assessed.   Aorta: The aortic root is normal in size and structure.   Shunts: The interatrial septum was not well visualized.     LEFT VENTRICLE         Normals PLAX 2D LVIDd:         5.80 cm 3.6 cm   Diastology                  Normals LVIDs:         3.80 cm 1.7 cm   LV e' lateral:   13.60 cm/s 6.42 cm/s LV PW:         1.00 cm 1.4 cm   LV E/e' lateral: 4.7        15.4 LV IVS:        0.90 cm 1.3 cm   LV e' medial:    10.90 cm/s 6.96 cm/s LV SV:         105 ml  79 ml    LV E/e' medial:  5.8        6.96 LV SV Index:   40.54   45 ml/m2     LEFT ATRIUM             Index       RIGHT ATRIUM           Index LA diam:        4.70 cm 1.89 cm/m  RA Area:     21.30 cm LA Vol (A2C):   75.8 ml 30.41 ml/m RA Volume:   71.70 ml  28.76 ml/m LA Vol (A4C):   48.1 ml 19.29 ml/m LA Biplane Vol: 67.1 ml 26.92 ml/m   AORTA  Normals Ao Root diam: 3.70 cm 31 mm   MITRAL VALVE               Normals MV Area (PHT): 2.13 cm MV PHT:        103.24 msec 55 ms MV Decel Time: 356 msec    187 ms MV E velocity: 63.40 cm/s 103 cm/s MV A velocity: 53.60 cm/s 70.3 cm/s MV E/A ratio:  1.18       1.5     Lonni Nanas MD Electronically signed by Lonni Nanas MD Signature Date/Time: 08/17/2019/2:04:53 PM       Final             ______________________________________________________________________________________________         Labs/Other Tests and Data Reviewed:     EKG:  An ECG dated 05/30/24 was personally reviewed today and demonstrated:  NSR 83 bpm with PVC and stable lateral TWI   Recent Labs: 01/08/2024: ALT 19; BUN 17; Creatinine, Ser 1.22; Potassium 4.4; Sodium 142    Recent Lipid Panel Labs (Brief)       Lab  Results  Component Value Date/Time    CHOL 103 01/08/2024 11:47 AM    TRIG 131 01/08/2024 11:47 AM    HDL 41 01/08/2024 11:47 AM    CHOLHDL 2.5 01/08/2024 11:47 AM    LDLCALC 39 01/08/2024 11:47 AM           Wt Readings from Last 3 Encounters:  05/30/24 259 lb (117.5 kg)  01/08/24 256 lb (116.1 kg)  05/25/23 263 lb 6.4 oz (119.5 kg)      Risk Assessment/Calculations:       STOP-Bang Score:  2      Objective:     Vital Signs:  There were no vitals taken for this visit.      ASSESSMENT & PLAN:     CAD/HLD, LDL goal 70- Prior NSTEMI and PCI 06/2019 with PTCA/DES to LAD, posterolateral artery, prox RCA. Abnormal ETT and myoview. No chest pain, dyspnea. Given abnormal myoview, plan for ischemic evaluation with LHC with regular hydration per protocols.  BMET/CBC Monday at [facility] lab Aware to hold Ozempic  07/06/24 until after LHC   HTN- Continue Valsartan  40mg  daily. Discussed to monitor BP at home at least 2 hours after medications and sitting for 5-10 minutes.    DM2 / Obesity - Congratulated on weight loss through lifestyle changes and Ozempic . Continue Ozempic  2mg  weekly. He is aware to hold Ozempic   07/06/24 until after [facility], usually takes on Mondays.    Syncope - prior syncope with lab draws consistent with vasovagal. No indication for cardiac workup. No recurrence.    Sleep apnea- diagnosis many years ago. Has not worn CPAP in some time. Since weight loss: no snoring, no daytime somnolence. No indication for CPAP at this time.         Informed Consent Shared Decision Making/Informed Consent The risks [stroke (1 in 1000), death (1 in 1000), kidney failure [usually temporary] (1 in 500), bleeding (1 in 200), allergic reaction [possibly serious] (1 in 200)], benefits (diagnostic support and management of coronary artery disease) and alternatives of a cardiac catheterization were discussed in detail with Mr. Palin and he is willing to proceed.            Time:    Today, I have spent 10 minutes with the patient with telehealth technology discussing the above problems.       Medication Adjustments/Labs and Tests Ordered: Current medicines are reviewed at  length with the patient today.  Concerns regarding medicines are outlined above.    Tests Ordered:    Orders Placed This Encounter  Procedures   Basic metabolic panel with GFR   CBC      Medication Changes: No orders of the defined types were placed in this encounter.     Follow Up:  2-3 weeks after LHC    Signed, Reche GORMAN Finder, NP  07/01/2024 12:38 PM    [facility]

## 2024-07-07 NOTE — Interval H&P Note (Signed)
 History and Physical Interval Note:  07/07/2024 8:46 AM  Randall Mullen  has presented today for surgery, with the diagnosis of cad.  The various methods of treatment have been discussed with the patient and family. After consideration of risks, benefits and other options for treatment, the patient has consented to  Procedure(s): LEFT HEART CATH AND CORONARY ANGIOGRAPHY (N/A) as a surgical intervention.  The patient's history has been reviewed, patient examined, no change in status, stable for surgery.  I have reviewed the patient's chart and labs.  Questions were answered to the patient's satisfaction.   Cath Lab Visit (complete for each Cath Lab visit)  Clinical Evaluation Leading to the Procedure:   ACS: No.  Non-ACS:    Anginal Classification: CCS I  Anti-ischemic medical therapy: No Therapy  Non-Invasive Test Results: Low-risk stress test findings: cardiac mortality <1%/year  Prior CABG: No previous CABG        [facility] 07/07/2024 8:46 AM

## 2024-07-12 ENCOUNTER — Ambulatory Visit (HOSPITAL_BASED_OUTPATIENT_CLINIC_OR_DEPARTMENT_OTHER): Admitting: Family

## 2024-07-15 ENCOUNTER — Encounter (HOSPITAL_BASED_OUTPATIENT_CLINIC_OR_DEPARTMENT_OTHER): Payer: Self-pay

## 2024-07-15 NOTE — Telephone Encounter (Signed)
 We can move him to the 2:45 pm slot that day. I just needed the 3:35 held, not the 2:45 :)  Grayce Budden

## 2024-07-18 ENCOUNTER — Ambulatory Visit (HOSPITAL_BASED_OUTPATIENT_CLINIC_OR_DEPARTMENT_OTHER): Admitting: Family

## 2024-07-18 ENCOUNTER — Encounter (HOSPITAL_BASED_OUTPATIENT_CLINIC_OR_DEPARTMENT_OTHER): Payer: Self-pay | Admitting: Family

## 2024-07-18 VITALS — BP 98/70 | HR 94 | Ht 75.0 in | Wt 257.8 lb

## 2024-07-18 DIAGNOSIS — I25118 Atherosclerotic heart disease of native coronary artery with other forms of angina pectoris: Secondary | ICD-10-CM

## 2024-07-18 DIAGNOSIS — E785 Hyperlipidemia, unspecified: Secondary | ICD-10-CM

## 2024-07-18 NOTE — Patient Instructions (Signed)
 Medication Instructions:  STOP Valsartan  *If you need a refill on your cardiac medications before your next appointment, please call your pharmacy*  Follow-Up: At [facility], you and your health needs are our priority.  As part of our continuing mission to provide you with exceptional heart care, our providers are all part of one team.  This team includes your primary Cardiologist (physician) and Advanced Practice Providers or APPs (Physician Assistants and Nurse Practitioners) who all work together to provide you with the care you need, when you need it.  Your next appointment:   6 month(s)  Provider:   Shelda Bruckner, MD or Reche Finder, NP    We recommend signing up for the patient portal called MyChart.  Sign up information is provided on this After Visit Summary.  MyChart is used to connect with patients for Virtual Visits (Telemedicine).  Patients are able to view lab/test results, encounter notes, upcoming appointments, etc.  Non-urgent messages can be sent to your provider as well.   To learn more about what you can do with MyChart, go to [email or web address].   Other Instructions       Fit3D 3D Body Scanners allow patients to capture and track valuable data on their way to reaching their wellness and body shape goals. The scanner measures body circumference, body composition, posture, weight, balance, and distribution of fat and muscle so that patients can target exercise strategies toward stubborn areas. The cost of a scan is $25 and you don't need to be a patient at the [facility] to get a scan.  [facility] is located at [address] [place], [place] [address].  Hours: Monday thru Thursday 7am-5pm. Call [phone] for more information.

## 2024-07-18 NOTE — Progress Notes (Signed)
 Cardiology Office Note:  .   Date:  07/18/2024  ID:  Randall Mullen, DOB [birth year], MRN [id number] PCP: Lennie Boom, MD  [facility] Providers Cardiologist:  Shelda Bruckner, MD    History of Present Illness: .   Randall Mullen is a 53 y.o. male  with a hx of HLD, CAD with NSTEMI and multivessel stent.  Prior NSTEMI and PCI 06/2019 with PTCA/DES to LAD, , posterolateral artery, prox RCA. Echo at the time with LVEF 55-60%, mild lVH, early diastolic dysfunction, no significant valvular abnormalities.    When seen 05/2020 Plavix was discontinued and Metoprolol  Tartrate consolidated to Metoprolol  Succinate.   At visit 05/25/2023 atorvastatin  reduced from 80 mg to 40 mg.  He was tapered off metoprolol . Seen 12/2023 doing well.   At visit 05/30/24 Valsartan  reduced due to hypotension. 06/28/24 ETT for DOT clearance with exercise capacity 13.7 METS though 3mm horizontal ST depression in inferior and lateral leads suggestive of ischemia. Lexiscan myoview 06/30/24 consistent with ischemia with small defect with mild reduction in inferolateral location. Ultimately had LHC 07/07/24 with single vessel occlusive CAD with CTO of mid LCx with collaterals and excellent patency of stents in RCA, PL, LAD.  Presents today for follow up.  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  Has opted to defer DOT renewal at this time. Exercising regularly with combination of cardio and strength training.Reports no shortness of breath nor dyspnea on exertion. Reports no chest pain, pressure, or tightness. No edema, orthopnea, PND. Reports no palpitations.  BP at home remains relatively hypotensive despite reduced dose Valsartan .    ROS: Please see the history of present illness.    All other systems reviewed and are negative.   Studies Reviewed: .          Cardiac Studies & Procedures    ______________________________________________________________________________________________ CARDIAC CATHETERIZATION  CARDIAC CATHETERIZATION 07/07/2024  Conclusion   Ost Cx to Prox Cx lesion is 50% stenosed.   Mid Cx to Dist Cx lesion is 100% stenosed.   Non-stenotic Prox LAD lesion was previously treated.   Non-stenotic Prox RCA lesion was previously treated.   Non-stenotic RPAV lesion was previously treated.   LV end diastolic pressure is normal.  Single vessel occlusive CAD with CTO of the mid LCx with collaterals. Continued excellent patency of stents in the RCA, PL, and LAD. Normal LVEDP  Plan: abnormal stress test is related to LCx occlusion. He is asymptomatic. Continue medical therapy. Should be cleared for CDL.  Findings Coronary Findings Diagnostic  Dominance: Right  Left Anterior Descending Non-stenotic Prox LAD lesion was previously treated.  Left Circumflex Ost Cx to Prox Cx lesion is 50% stenosed. Mid Cx to Dist Cx lesion is 100% stenosed. The lesion is chronically occluded.  Third Obtuse Marginal Branch Collaterals 3rd Mrg filled by collaterals from 2nd Mrg.  Collaterals 3rd Mrg filled by collaterals from 3rd RPL.  Right Coronary Artery Vessel is large. Non-stenotic Prox RCA lesion was previously treated.  Right Posterior Atrioventricular Artery Non-stenotic RPAV lesion was previously treated.  Intervention  No interventions have been documented.   CARDIAC CATHETERIZATION  CARDIAC CATHETERIZATION 06/30/2019  Conclusion  Prox LAD lesion is 99% stenosed.  Ost Cx to Prox Cx lesion is 50% stenosed.  Mid Cx to Dist Cx lesion is 100% stenosed.  Prox RCA lesion is 80% stenosed.  RPAV lesion is 99% stenosed.  A drug-eluting stent was successfully placed using a STENT RESOLUTE ONYX 3.5X26.  Post intervention, there is a 0% residual stenosis.  A drug-eluting stent was successfully placed using a STENT RESOLUTE ONYX 2.25X30.  Post  intervention, there is a 0% residual stenosis.  A drug-eluting stent was successfully placed using a STENT RESOLUTE ONYX 4.0X18.  Post intervention, there is a 0% residual stenosis.  1. Successful PTCA/DES x 1 proximal LAD 2. Successful PTCA/DES x 1 posterolateral artery 3. Successful PTCA/DES x 1 proximal RCA  Will continue DAPT with ASA and Brilinta  for one year. Continue statin and beta blocker. Likely d/c home tomorrow after hydration post dye load. BMET in am.  Findings Coronary Findings Diagnostic  Dominance: Right  Left Anterior Descending Prox LAD lesion is 99% stenosed.  Left Circumflex Ost Cx to Prox Cx lesion is 50% stenosed. Mid Cx to Dist Cx lesion is 100% stenosed. The lesion is chronically occluded.  Third Obtuse Marginal Branch Collaterals 3rd Mrg filled by collaterals from 2nd Mrg.  Right Coronary Artery Vessel is large. Prox RCA lesion is 80% stenosed.  Right Posterior Atrioventricular Artery RPAV lesion is 99% stenosed.  Intervention  Prox LAD lesion Stent CATH VISTA GUIDE 6FR XBLAD3.5 guide catheter was inserted. Lesion crossed with guidewire using a WIRE COUGAR XT STRL 190CM. Pre-stent angioplasty was performed using a BALLOON SAPPHIRE 2.0X15. A drug-eluting stent was successfully placed using a STENT RESOLUTE ONYX 3.5X26. Post-stent angioplasty was performed using a BALLOON SAPPHIRE [place] [id number]. Post-Intervention Lesion Assessment The intervention was successful. Pre-interventional TIMI flow is 3. Post-intervention TIMI flow is 3. No complications occurred at this lesion. There is a 0% residual stenosis post intervention.  Prox RCA lesion Stent CATH VISTA GUIDE 6FR JR4 guide catheter was inserted. Pre-stent angioplasty was performed using a BALLOON SAPPHIRE 2.0X15. A drug-eluting stent was successfully placed using a STENT RESOLUTE ONYX 4.0X18. Stent strut is well apposed. Post-stent angioplasty was performed using a BALLOON SAPPHIRE [place]  4.0X15. Post-Intervention Lesion Assessment The intervention was successful. Pre-interventional TIMI flow is 3. Post-intervention TIMI flow is 3. No complications occurred at this lesion. There is a 0% residual stenosis post intervention.  RPAV lesion Stent CATH VISTA GUIDE 6FR JR4 guide catheter was inserted. Lesion crossed with guidewire using a WIRE COUGAR XT STRL 190CM. Pre-stent angioplasty was performed using a BALLOON SAPPHIRE 2.0X15. A drug-eluting stent was successfully placed using a STENT RESOLUTE ONYX 2.25X30. Post-stent angioplasty was performed using a BALLOON SAPPHIRE [place] 2.5X15. Post-Intervention Lesion Assessment The intervention was successful. Pre-interventional TIMI flow is 3. Post-intervention TIMI flow is 3. No complications occurred at this lesion. There is a 0% residual stenosis post intervention.   STRESS TESTS  MYOCARDIAL PERFUSION IMAGING 06/30/2024  Interpretation Summary   LV perfusion is abnormal. There is evidence of ischemia. There is no evidence of infarction. Defect 1: There is a small defect with mild reduction in uptake present in the mid to basal inferolateral location(s) that is reversible. There is normal wall motion in the defect area. Consistent with ischemia.   Left ventricular function is normal. Nuclear stress EF: 58%. The left ventricular ejection fraction is normal (55-65%). End diastolic cavity size is normal.   Coronary calcium  assessment not performed due to prior revascularization.   Findings are consistent with ischemia. The study is low risk.   ECHOCARDIOGRAM  ECHOCARDIOGRAM LIMITED 08/17/2019  Narrative ECHOCARDIOGRAM LIMITED REPORT    Patient Name:   IKTAN AIKMAN Date of Exam: 08/17/2019 Medical Rec #:  [id number]          Height:       75.0  in Accession #:    [id number]         Weight:       270.0 lb Date of Birth:  [birth year]         BSA:          2.49 m Patient Age:    48 years           BP:           114/73 mmHg Patient  Gender: M                  HR:           101 bpm. Exam Location:  Inpatient   Procedure: Limited Echo, Limited Color Doppler and Color Doppler  Indications:    Abnormal ECG R94.31  History:        Patient has prior history of Echocardiogram examinations, most recent 06/30/2019.  Sonographer:    Augustin Seals RDCS (AE) Referring Phys: [id number] [facility]  IMPRESSIONS   1. Left ventricular ejection fraction, by visual estimation, is 60 to 65%. The left ventricle has normal function. There is mildly increased left ventricular hypertrophy. 2. Global right ventricle has normal systolic function.The right ventricular size is mildly enlarged. 3. Left atrial size was normal. 4. Right atrial size was normal. 5. The mitral valve is normal in structure. No evidence of mitral valve regurgitation. 6. The tricuspid valve is normal in structure. Tricuspid valve regurgitation is not demonstrated. 7. The aortic valve is tricuspid. Aortic valve regurgitation is not visualized. 8. The pulmonic valve was not well visualized. Pulmonic valve regurgitation not assessed. 9. TR signal is inadequate for assessing pulmonary artery systolic pressure.  FINDINGS Left Ventricle: Left ventricular ejection fraction, by visual estimation, is 60 to 65%. The left ventricle has normal function. The left ventricular internal cavity size was the left ventricle is normal in size. There is mildly increased left ventricular hypertrophy. Left ventricular diastolic parameters were normal.  Right Ventricle: The right ventricular size is mildly enlarged. No increase in right ventricular wall thickness. Global RV systolic function is has normal systolic function.  Left Atrium: Left atrial size was normal in size.  Right Atrium: Right atrial size was normal in size  Pericardium: There is no evidence of pericardial effusion.  Mitral Valve: The mitral valve is normal in structure. MV Area by PHT, 2.13 cm. MV PHT, 103.24  msec. No evidence of mitral valve regurgitation.  Tricuspid Valve: The tricuspid valve is normal in structure. Tricuspid valve regurgitation is not demonstrated.  Aortic Valve: The aortic valve is tricuspid. Aortic valve regurgitation is not visualized.  Pulmonic Valve: The pulmonic valve was not well visualized. Pulmonic valve regurgitation not assessed.  Aorta: The aortic root is normal in size and structure.  Shunts: The interatrial septum was not well visualized.   LEFT VENTRICLE         Normals PLAX 2D LVIDd:         5.80 cm 3.6 cm   Diastology                  Normals LVIDs:         3.80 cm 1.7 cm   LV e' lateral:   13.60 cm/s 6.42 cm/s LV PW:         1.00 cm 1.4 cm   LV E/e' lateral: 4.7        15.4 LV IVS:        0.90 cm 1.3 cm   LV  e' medial:    10.90 cm/s 6.96 cm/s LV SV:         105 ml  79 ml    LV E/e' medial:  5.8        6.96 LV SV Index:   40.54   45 ml/m2   LEFT ATRIUM             Index       RIGHT ATRIUM           Index LA diam:        4.70 cm 1.89 cm/m  RA Area:     21.30 cm LA Vol (A2C):   75.8 ml 30.41 ml/m RA Volume:   71.70 ml  28.76 ml/m LA Vol (A4C):   48.1 ml 19.29 ml/m LA Biplane Vol: 67.1 ml 26.92 ml/m  AORTA                 Normals Ao Root diam: 3.70 cm 31 mm  MITRAL VALVE               Normals MV Area (PHT): 2.13 cm MV PHT:        103.24 msec 55 ms MV Decel Time: 356 msec    187 ms MV E velocity: 63.40 cm/s 103 cm/s MV A velocity: 53.60 cm/s 70.3 cm/s MV E/A ratio:  1.18       1.5   Lonni Nanas MD Electronically signed by Lonni Nanas MD Signature Date/Time: 08/17/2019/2:04:53 PM    Final          ______________________________________________________________________________________________        Risk Assessment/Calculations:         STOP-Bang Score:  2      Physical Exam:   VS:  BP 98/70 (BP Location: Left Arm, Patient Position: Sitting, Cuff Size: Large)   Pulse 94   Ht 6' 3 (1.905 m)   Wt 257 lb  12.8 oz (116.9 kg)   SpO2 98%   BMI 32.22 kg/m    Wt Readings from Last 3 Encounters:  07/18/24 257 lb 12.8 oz (116.9 kg)  07/07/24 250 lb (113.4 kg)  05/30/24 259 lb (117.5 kg)    Vitals:   07/18/24 1457  BP: 98/70  Pulse: 94  Height: 6' 3 (1.905 m)  Weight: 257 lb 12.8 oz (116.9 kg)  SpO2: 98%  BMI (Calculated): 32.22     GEN: Well nourished, well developed in no acute distress NECK: No JVD; No carotid bruits CARDIAC: RRR, no murmurs, rubs, gallops RESPIRATORY:  Clear to auscultation without rales, wheezing or rhonchi  ABDOMEN: Soft, non-tender, non-distended EXTREMITIES:  No edema; No deformity   ASSESSMENT AND PLAN: .    CAD/HLD, LDL goal 70-Prior NSTEMI and PCI 06/2019 with PTCA/DES to LAD, , posterolateral artery, prox RCA. Echo at the time with LVEF 55-60%, mild lVH, early diastolic dysfunction, no significant valvular abnormalities. LHC 07/07/24 with patent stents. GDMT Aspirin  81mg  dialy, atorvastatin  40mg  dialy, repatha  140mg  q14days. Recommend aiming for 150 minutes of moderate intensity activity per week and following a heart healthy diet.    HTN-given relative hypotension, stop Valsartan . Discussed to monitor BP at home at least 2 hours after medications and sitting for 5-10 minutes. He will contact our office if BP at home routinely >130/80.   DM2 / Obesity -  12/2023 A1c 5.6. congratulated on weight loss.   Syncope -no recurrence, no indication for further evaluation  Sleep apnea- diagnosis many years ago. Has not worn CPAP in some  time. Since weight loss: no snoring, no daytime somnolence. No indication for CPAP at this time.            Dispo: follow up  in 6 months   Signed, Reche GORMAN Finder, NP

## 2024-07-23 ENCOUNTER — Encounter (HOSPITAL_BASED_OUTPATIENT_CLINIC_OR_DEPARTMENT_OTHER): Payer: Self-pay | Admitting: Family

## 2024-08-02 ENCOUNTER — Other Ambulatory Visit (HOSPITAL_BASED_OUTPATIENT_CLINIC_OR_DEPARTMENT_OTHER): Payer: Self-pay | Admitting: Cardiology

## 2024-08-02 DIAGNOSIS — I25118 Atherosclerotic heart disease of native coronary artery with other forms of angina pectoris: Secondary | ICD-10-CM

## 2024-08-02 DIAGNOSIS — E785 Hyperlipidemia, unspecified: Secondary | ICD-10-CM

## 2024-08-16 ENCOUNTER — Other Ambulatory Visit: Payer: Self-pay | Admitting: Medical Genetics

## 2024-08-16 DIAGNOSIS — Z006 Encounter for examination for normal comparison and control in clinical research program: Secondary | ICD-10-CM

## 2024-08-17 ENCOUNTER — Other Ambulatory Visit (HOSPITAL_COMMUNITY)

## 2024-08-24 LAB — GENECONNECT MOLECULAR SCREEN: Genetic Analysis Overall Interpretation: NEGATIVE

## 2024-10-11 ENCOUNTER — Encounter (HOSPITAL_BASED_OUTPATIENT_CLINIC_OR_DEPARTMENT_OTHER): Payer: Self-pay

## 2024-10-18 ENCOUNTER — Telehealth: Payer: Self-pay | Admitting: Pharmacy Technician

## 2024-10-18 ENCOUNTER — Other Ambulatory Visit (HOSPITAL_COMMUNITY): Payer: Self-pay

## 2024-10-18 NOTE — Telephone Encounter (Signed)
 Pharmacy Patient Advocate Encounter  Received notification from [facility] that Prior Authorization for repatha  has been APPROVED from 10/18/24 to 10/18/25   PA #/Case ID/Reference #: [id number]

## 2024-10-18 NOTE — Telephone Encounter (Signed)
" ° °  Pharmacy Patient Advocate Encounter   Received notification from [facility] that prior authorization for REPATHA  is required/requested.   Insurance verification completed.   The patient is insured through [facility].   Per test claim: PA required; PA submitted to above mentioned insurance via Latent Key/confirmation #/EOC [id number] Status is pending  "
